# Patient Record
Sex: Male | Born: 1985 | Race: White | Hispanic: No | Marital: Married | Smoking: Current every day smoker
Health system: Southern US, Community
[De-identification: ages and names within clinical notes are randomized; demographics above are authoritative.]

## PROBLEM LIST (undated history)

## (undated) DIAGNOSIS — R51 Headache: Secondary | ICD-10-CM

## (undated) DIAGNOSIS — J45909 Unspecified asthma, uncomplicated: Secondary | ICD-10-CM

## (undated) DIAGNOSIS — E109 Type 1 diabetes mellitus without complications: Secondary | ICD-10-CM

## (undated) HISTORY — DX: Unspecified asthma, uncomplicated: J45.909

## (undated) HISTORY — PX: HERNIA REPAIR: SHX51

## (undated) HISTORY — DX: Headache: R51

## (undated) HISTORY — DX: Type 1 diabetes mellitus without complications: E10.9

---
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
## 2006-09-03 ENCOUNTER — Emergency Department (HOSPITAL_COMMUNITY): Admission: EM | Admit: 2006-09-03 | Discharge: 2006-09-04 | Payer: Self-pay | Admitting: Emergency Medicine

## 2006-09-04 ENCOUNTER — Inpatient Hospital Stay (HOSPITAL_COMMUNITY): Admission: EM | Admit: 2006-09-04 | Discharge: 2006-09-07 | Payer: Self-pay | Admitting: Emergency Medicine

## 2007-10-07 ENCOUNTER — Emergency Department (HOSPITAL_COMMUNITY): Admission: EM | Admit: 2007-10-07 | Discharge: 2007-10-07 | Payer: Self-pay | Admitting: Emergency Medicine

## 2008-02-26 ENCOUNTER — Ambulatory Visit: Payer: Self-pay | Admitting: Diagnostic Radiology

## 2008-02-26 ENCOUNTER — Emergency Department (HOSPITAL_BASED_OUTPATIENT_CLINIC_OR_DEPARTMENT_OTHER): Admission: EM | Admit: 2008-02-26 | Discharge: 2008-02-26 | Payer: Self-pay | Admitting: Emergency Medicine

## 2008-04-07 ENCOUNTER — Ambulatory Visit: Payer: Self-pay | Admitting: Diagnostic Radiology

## 2008-04-07 ENCOUNTER — Emergency Department (HOSPITAL_BASED_OUTPATIENT_CLINIC_OR_DEPARTMENT_OTHER): Admission: EM | Admit: 2008-04-07 | Discharge: 2008-04-07 | Payer: Self-pay | Admitting: Emergency Medicine

## 2008-05-30 ENCOUNTER — Emergency Department (HOSPITAL_BASED_OUTPATIENT_CLINIC_OR_DEPARTMENT_OTHER): Admission: EM | Admit: 2008-05-30 | Discharge: 2008-05-30 | Payer: Self-pay | Admitting: Emergency Medicine

## 2008-06-21 ENCOUNTER — Emergency Department (HOSPITAL_BASED_OUTPATIENT_CLINIC_OR_DEPARTMENT_OTHER): Admission: EM | Admit: 2008-06-21 | Discharge: 2008-06-21 | Payer: Self-pay | Admitting: Emergency Medicine

## 2008-07-18 ENCOUNTER — Emergency Department (HOSPITAL_BASED_OUTPATIENT_CLINIC_OR_DEPARTMENT_OTHER): Admission: EM | Admit: 2008-07-18 | Discharge: 2008-07-18 | Payer: Self-pay | Admitting: Emergency Medicine

## 2008-10-05 ENCOUNTER — Emergency Department (HOSPITAL_BASED_OUTPATIENT_CLINIC_OR_DEPARTMENT_OTHER): Admission: EM | Admit: 2008-10-05 | Discharge: 2008-10-05 | Payer: Self-pay | Admitting: Emergency Medicine

## 2008-10-05 ENCOUNTER — Ambulatory Visit: Payer: Self-pay | Admitting: Diagnostic Radiology

## 2008-11-02 ENCOUNTER — Emergency Department (HOSPITAL_BASED_OUTPATIENT_CLINIC_OR_DEPARTMENT_OTHER): Admission: EM | Admit: 2008-11-02 | Discharge: 2008-11-02 | Payer: Self-pay | Admitting: Emergency Medicine

## 2008-11-02 ENCOUNTER — Ambulatory Visit: Payer: Self-pay | Admitting: Diagnostic Radiology

## 2009-04-19 ENCOUNTER — Emergency Department (HOSPITAL_BASED_OUTPATIENT_CLINIC_OR_DEPARTMENT_OTHER): Admission: EM | Admit: 2009-04-19 | Discharge: 2009-04-20 | Payer: Self-pay | Admitting: Emergency Medicine

## 2009-04-20 ENCOUNTER — Ambulatory Visit: Payer: Self-pay | Admitting: Radiology

## 2009-07-05 ENCOUNTER — Ambulatory Visit: Payer: Self-pay | Admitting: Diagnostic Radiology

## 2009-07-05 ENCOUNTER — Emergency Department (HOSPITAL_BASED_OUTPATIENT_CLINIC_OR_DEPARTMENT_OTHER): Admission: EM | Admit: 2009-07-05 | Discharge: 2009-07-06 | Payer: Self-pay | Admitting: Emergency Medicine

## 2009-07-09 ENCOUNTER — Ambulatory Visit: Payer: Self-pay | Admitting: Internal Medicine

## 2009-07-09 DIAGNOSIS — R51 Headache: Secondary | ICD-10-CM | POA: Insufficient documentation

## 2009-07-09 DIAGNOSIS — R519 Headache, unspecified: Secondary | ICD-10-CM | POA: Insufficient documentation

## 2009-07-09 DIAGNOSIS — F17200 Nicotine dependence, unspecified, uncomplicated: Secondary | ICD-10-CM | POA: Insufficient documentation

## 2009-07-09 DIAGNOSIS — E109 Type 1 diabetes mellitus without complications: Secondary | ICD-10-CM | POA: Insufficient documentation

## 2009-07-09 DIAGNOSIS — J45909 Unspecified asthma, uncomplicated: Secondary | ICD-10-CM | POA: Insufficient documentation

## 2009-07-23 ENCOUNTER — Telehealth: Payer: Self-pay | Admitting: Internal Medicine

## 2009-07-25 ENCOUNTER — Ambulatory Visit: Payer: Self-pay | Admitting: Diagnostic Radiology

## 2009-07-25 ENCOUNTER — Emergency Department (HOSPITAL_BASED_OUTPATIENT_CLINIC_OR_DEPARTMENT_OTHER): Admission: EM | Admit: 2009-07-25 | Discharge: 2009-07-25 | Payer: Self-pay | Admitting: Emergency Medicine

## 2009-08-02 ENCOUNTER — Emergency Department (HOSPITAL_BASED_OUTPATIENT_CLINIC_OR_DEPARTMENT_OTHER): Admission: EM | Admit: 2009-08-02 | Discharge: 2009-08-03 | Payer: Self-pay | Admitting: Emergency Medicine

## 2009-08-10 ENCOUNTER — Ambulatory Visit: Payer: Self-pay | Admitting: Internal Medicine

## 2009-08-10 LAB — CONVERTED CEMR LAB
BUN: 9 mg/dL (ref 6–23)
Chloride: 100 meq/L (ref 96–112)
Creatinine, Ser: 0.73 mg/dL (ref 0.40–1.50)
Creatinine, Urine: 82.1 mg/dL
Glucose, Bld: 224 mg/dL — ABNORMAL HIGH (ref 70–99)
Microalb Creat Ratio: 9 mg/g (ref 0.0–30.0)
Potassium: 3.8 meq/L (ref 3.5–5.3)
Sodium: 138 meq/L (ref 135–145)

## 2009-08-11 ENCOUNTER — Encounter: Payer: Self-pay | Admitting: Internal Medicine

## 2009-11-02 ENCOUNTER — Encounter: Payer: Self-pay | Admitting: Internal Medicine

## 2009-11-02 LAB — CONVERTED CEMR LAB
CO2: 26 meq/L (ref 19–32)
Chloride: 102 meq/L (ref 96–112)
Creatinine, Ser: 0.72 mg/dL (ref 0.40–1.50)
Creatinine, Urine: 53.6 mg/dL
Indirect Bilirubin: 0.4 mg/dL (ref 0.0–0.9)
Microalb Creat Ratio: 9.3 mg/g (ref 0.0–30.0)
Potassium: 4.3 meq/L (ref 3.5–5.3)
Total Bilirubin: 0.5 mg/dL (ref 0.3–1.2)
Total CHOL/HDL Ratio: 3.8
VLDL: 35 mg/dL (ref 0–40)

## 2009-11-11 ENCOUNTER — Ambulatory Visit: Payer: Self-pay | Admitting: Internal Medicine

## 2009-11-16 ENCOUNTER — Ambulatory Visit: Payer: Self-pay | Admitting: Diagnostic Radiology

## 2009-11-16 ENCOUNTER — Telehealth: Payer: Self-pay | Admitting: Internal Medicine

## 2009-11-16 ENCOUNTER — Emergency Department (HOSPITAL_BASED_OUTPATIENT_CLINIC_OR_DEPARTMENT_OTHER): Admission: EM | Admit: 2009-11-16 | Discharge: 2009-11-16 | Payer: Self-pay | Admitting: Emergency Medicine

## 2010-01-14 ENCOUNTER — Ambulatory Visit: Payer: Self-pay | Admitting: Internal Medicine

## 2010-03-01 ENCOUNTER — Ambulatory Visit
Admission: RE | Admit: 2010-03-01 | Discharge: 2010-03-01 | Payer: Self-pay | Source: Home / Self Care | Attending: Internal Medicine | Admitting: Internal Medicine

## 2010-03-01 DIAGNOSIS — M76899 Other specified enthesopathies of unspecified lower limb, excluding foot: Secondary | ICD-10-CM | POA: Insufficient documentation

## 2010-03-01 DIAGNOSIS — M25569 Pain in unspecified knee: Secondary | ICD-10-CM | POA: Insufficient documentation

## 2010-03-31 LAB — CONVERTED CEMR LAB
BUN: 15 mg/dL (ref 6–23)
Calcium: 9.9 mg/dL (ref 8.4–10.5)
Creatinine, Ser: 0.8 mg/dL (ref 0.40–1.50)
Glucose, Bld: 126 mg/dL — ABNORMAL HIGH (ref 70–99)
Hgb A1c MFr Bld: 8.9 % — ABNORMAL HIGH (ref <5.7)

## 2010-03-31 NOTE — Assessment & Plan Note (Signed)
Summary: 3 month fu/dt--Rm 3   Vital Signs:  Patient profile:   25 year old male Height:      71 inches Weight:      183.50 pounds BMI:     25.69 O2 Sat:      98 % on Room air Temp:     98.0 degrees F oral Pulse rate:   98 / minute Pulse rhythm:   regular Resp:     16 per minute BP sitting:   120 / 70  (right arm) Cuff size:   regular  Vitals Entered By: Mervin Kung CMA Goedde Dull) (November 11, 2009 3:32 PM)  O2 Flow:  Room air CC: Rm 3    3 month f/u.  Is Patient Diabetic? Yes   Primary Care Provider:  DThomos Lemons DO  CC:  Rm 3    3 month f/u. Marland Kitchen  History of Present Illness:  Type 1 Diabetes Mellitus Follow-Up      This is a 25 year old man who presents for Type 1 diabetes mellitus follow-up.  The patient reports polyuria, but denies self managed hypoglycemia and weight gain.  The patient denies the following symptoms: chest pain.  Since the last visit the patient reports poor dietary compliance, not exercising regularly, and monitoring blood glucose.    Preventive Screening-Counseling & Management  Alcohol-Tobacco     Alcohol drinks/day: 1     Alcohol type: all     Smoking Status: current     Packs/Day: 1.5     Year Started: 2005  Allergies (verified): No Known Drug Allergies  Past History:  Past Medical History: Asthma  -  childhood  Diabetes mellitus, type I Headache     Past Surgical History: Hernia repair at birth     Family History: Family History of Arthritis Family History Breast cancer -PGM Family History Hypertension -mother , brother Family History of Stroke - MGM DM II - sister, mother borderline colon ca - no  prostate - no    Social History: 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     Impression & Recommendations:  Problem # 1:  DIABETES  MELLITUS, TYPE I (ICD-250.01) Assessment Unchanged poor control.  Pt counseled on diet and exercise.  His updated medication list for this problem includes:    Lantus Solostar 100 Unit/ml Soln (Insulin glargine) ..... Use 20-40 units at bedtime    Novolog Flexpen 100 Unit/ml Soln (Insulin aspart) ..... Use sliding 10-20 units three times a day before meals  Labs Reviewed: Creat: 0.72 (11/02/2009)    Reviewed HgBA1c results: 10.5 (11/02/2009)  10.2 (08/10/2009)  Problem # 2:  TOBACCO ABUSE (ICD-305.1)  Encouraged smoking cessation and discussed different methods for smoking cessation.  trial of wellbutrin.    Complete Medication List: 1)  Lantus Solostar 100 Unit/ml Soln (Insulin glargine) .... Use 20-40 units at bedtime 2)  Novolog Flexpen 100 Unit/ml Soln (Insulin aspart) .... Use sliding 10-20 units three times a day before meals 3)  Relion Pen Needles 31g X 8 Mm Misc (Insulin pen needle) .... Use qid as directed 4)  Onetouch Ultra Test Strp (Glucose blood) .... Use qid as directed 5)  Bupropion Hcl 150 Mg Xr24h-tab (Bupropion hcl) .... One by mouth once daily  Patient Instructions: 1)  Please schedule a follow-up appointment in 3 months.  Prescriptions: NOVOLOG FLEXPEN 100 UNIT/ML SOLN (INSULIN ASPART) use sliding 10-20 units three times a day before meals  #3 month x 1   Entered and Authorized by:   D. Thomos Lemons DO   Signed by:   D. Thomos Lemons DO on 11/11/2009   Method used:   Electronically to        [facility]* (retail)             ,          Ph: [phone]       Fax: [phone]   RxID:   [id number] LANTUS SOLOSTAR 100 UNIT/ML SOLN (INSULIN GLARGINE) use 20-40 units at bedtime  #3 month x 1   Entered and Authorized by:   D. Thomos Lemons DO   Signed by:   D. Thomos Lemons DO on 11/11/2009   Method used:   Electronically to        [facility]* (retail)             ,          Ph: [phone]       Fax: [phone]   RxID:   [id number] BUPROPION HCL  150 MG XR24H-TAB (BUPROPION HCL) one by mouth once daily  #30 x 2   Entered and Authorized by:   D. Thomos Lemons DO   Signed by:   D. Thomos Lemons DO on 11/11/2009   Method used:   Electronically to        [facility] # [id number]* (retail)       [address] [place], [place]  [address]       Ph: [phone]       Fax: [phone]   RxID:   [id number] NOVOLOG FLEXPEN 100 UNIT/ML SOLN (INSULIN ASPART) use sliding 10-20 units three times a day before meals  #1 month x 0   Entered and Authorized by:   D. Thomos Lemons DO   Signed by:   D. Thomos Lemons DO on 11/11/2009   Method used:   Electronically to        [facility] # [id number]* (retail)       [address] [place], [place]  [address]       Ph: [phone]       Fax: [phone]   RxID:   [id number] LANTUS SOLOSTAR 100 UNIT/ML SOLN (INSULIN GLARGINE) use 20-40 units at bedtime  #1 month x 0   Entered and Authorized by:   D. Thomos Lemons DO   Signed by:   D. Thomos Lemons DO on 11/11/2009   Method used:   Electronically to        [facility] # [phone]* (retail)       [address] [place], [place]  [address]       Ph: [phone]       Fax: [phone]   RxID:   [id number]    Current Allergies (reviewed today): No known allergies

## 2010-03-31 NOTE — Assessment & Plan Note (Signed)
Summary: NEW PT EST CARE IS DIABETIC/DT   Vital Signs:  Patient profile:   25 year old male Height:      71 inches Weight:      185.50 pounds BMI:     25.97 O2 Sat:      97 % on Room air Temp:     98.4 degrees F oral Pulse rate:   94 / minute Pulse rhythm:   regular Resp:     18 per minute BP sitting:   120 / 64  (right arm) Cuff size:   regular  Vitals Entered By: Glendell Docker CMA (Jul 09, 2009 2:25 PM)  O2 Flow:  Room air CC: Rm 2- New Patient  Is Patient Diabetic? Yes Comments out of diabetic medication low blood sugar 65 high 300, avg 180-200 elevation is in usually around 5pm   Primary Care Provider:  D. Thomos Lemons DO  CC:  Rm 2- New Patient .  History of Present Illness: 25 y/o white male with DM I to establish  Diabetes diagnosed at 62.  fairly well controlled Hx of DKA Type I diabetic recently got insurance Lantus 18 units at bedtime Novolog - meal time - dose based upon carb intake low to mid 200's couple of lows while taking novolog - self managed  up to date with eye exams - last exam Oct 2010 - at [place]  age 2 - used nebulizer,  no chronic asthma chronic tobacco user     Preventive Screening-Counseling & Management  Alcohol-Tobacco     Alcohol drinks/day: 1     Alcohol type: all     Smoking Status: current     Packs/Day: 1.5     Year Started: 2005  Caffeine-Diet-Exercise     Caffeine use/day: 4-5 beverages daily     Does Patient Exercise: yes     Times/week: <3  Past History:  Past Medical History: Asthma  -  childhood  Diabetes mellitus, type I Headache  Past Surgical History: Hernia repair at birth  Family History: Family History of Arthritis Family History Breast cancer -PGM Family History Hypertension -mother , brother Family History of Stroke - MGM DM II - sister, mother borderline colon ca - no prostate - no  Social History: 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   Head:  normocephalic and atraumatic.   Eyes:  pupils equal, pupils round, and pupils reactive to light.   Ears:  R ear normal and L ear normal.   Mouth:  Oral mucosa and oropharynx without lesions or exudates.  Teeth in good repair. Neck:  supple, no masses, and no carotid bruits.   Lungs:  normal respiratory effort, normal breath sounds, and no wheezes.   Heart:  normal rate, regular rhythm, and no gallop.   Abdomen:  soft, non-tender, normal bowel sounds, no hepatomegaly, and no splenomegaly.   Extremities:  No lower extremity edema  Neurologic:  cranial nerves II-XII intact and gait normal.    Diabetes Management Exam:    Foot Exam (with socks and/or shoes not present):  Inspection:          Left foot: normal          Right foot: normal   Impression & Recommendations:  Problem # 1:  DIABETES MELLITUS, TYPE I (ICD-250.01) Insulin refilled.  Pt understands how to titrate short acting insulin.  no hx of severe hypoglycemia  The following medications were removed from the medication list:    Novolin R 100 Unit/ml Soln (Insulin regular human) ..... Inject subcutaneously 6 units sliding scale as directed His updated medication list for this problem includes:    Lantus Solostar 100 Unit/ml Soln (Insulin glargine) .Marland KitchenMarland KitchenMarland KitchenMarland Kitchen 18 units once daily    Novolog Flexpen 100 Unit/ml Soln (Insulin aspart) ..... Use sliding 6-8 units three times a day before meals  Future Orders: T-Basic Metabolic Panel [id number]) ... 08/10/2009 T-Hepatic Function [id number]) ... 08/10/2009 T-Lipid Profile [id number]) ... 08/10/2009 T- Hemoglobin A1C  ([id number]) ... 08/10/2009 T-Urine Microalbumin w/creat. ratio [id number]) ... 08/10/2009  Problem # 2:  TOBACCO ABUSE (ICD-305.1) Strongly encouraged tob cessation.  start chantix.  we discussed common side effects  His updated medication list for this problem includes:    Chantix Starting Month Pak 0.5 Mg X 11 & 1 Mg X 42 Tabs (Varenicline tartrate) ..... Use as directed    Chantix Continuing Month Pak 1 Mg Tabs (Varenicline tartrate) ..... Use as directed  Complete Medication List: 1)  Lantus Solostar 100 Unit/ml Soln (Insulin glargine) .Marland Kitchen.. 18 units once daily 2)  Novolog Flexpen 100 Unit/ml Soln (Insulin aspart) .... Use sliding 6-8 units three times a day before meals 3)  Relion Pen Needles 31g X 8 Mm Misc (Insulin pen needle) .... Use qid as directed 4)  Chantix Starting Month Pak 0.5 Mg X 11 & 1 Mg X 42 Tabs (Varenicline tartrate) .... Use as directed 5)  Chantix Continuing Month Pak 1 Mg Tabs (Varenicline tartrate) .... Use as directed  Patient Instructions: 1)  Please schedule a follow-up appointment in 1 month. 2)  BMP prior to visit, ICD-9:  250.01 3)  Hepatic Panel prior to visit, ICD-9: 250.01 4)  Lipid Panel prior to visit, ICD-9: 250.01 5)  HbgA1C prior to visit, ICD-9: 250.01 6)  Urine Microalbumin prior to visit, ICD-9: 250.01 7)  Please return for lab work one (1) week before your next appointment.  Prescriptions: CHANTIX CONTINUING MONTH PAK 1 MG TABS (VARENICLINE TARTRATE) use as directed  #1 x 2   Entered and Authorized by:   D. Thomos Lemons DO   Signed by:   D. Thomos Lemons DO on 07/09/2009   Method used:   Print then Give to Patient   RxID:   [id number] CHANTIX STARTING MONTH PAK 0.5 MG X 11 & 1 MG X 42 TABS (VARENICLINE TARTRATE) use as directed  #1 x 0   Entered and Authorized by:   D. Thomos Lemons DO   Signed by:   D. Thomos Lemons DO on 07/09/2009   Method used:   Print then Give to Patient   RxID:   [id number] RELION PEN NEEDLES 31G X 8 MM  MISC (INSULIN PEN NEEDLE) use qid as directed  #120 x 2   Entered and Authorized by:   D. Thomos Lemons DO   Signed by:   D. Thomos Lemons DO on 07/09/2009   Method used:   Electronically to        [facility] # [id number]* (retail)       [address]       [place], [place]  [address]       Ph: [phone]       Fax: [phone]   RxID:   [id number] NOVOLOG FLEXPEN 100 UNIT/ML SOLN (INSULIN ASPART) use sliding 6-8 units three times a day before meals  #1 month x 2   Entered and Authorized by:   D. Thomos Lemons DO   Signed by:   D. Thomos Lemons DO on 07/09/2009   Method used:   Electronically to        [facility] # [id number]* (retail)       [address] [place], [place]  [address]       Ph: [phone]       Fax: [phone]   RxID:   [id number] LANTUS SOLOSTAR 100 UNIT/ML SOLN (INSULIN GLARGINE) 18 units once daily  #1 month x 2   Entered and Authorized by:   D. Thomos Lemons DO   Signed by:   D. Thomos Lemons DO on 07/09/2009   Method used:   Electronically to        [facility] # [id number]* (retail)       [address] [place], [place]  [address]       Ph: [phone]       Fax: [phone]   RxID:   [id number]    Immunization History:  Tetanus/Td Immunization History:    Tetanus/Td:  historical (07/12/2004)

## 2010-03-31 NOTE — Assessment & Plan Note (Signed)
Summary: 1 MONTH FU/DT--Rm 2   Vital Signs:  Patient profile:   25 year old male Height:      71 inches Weight:      180 pounds BMI:     25.20 Temp:     98.3 degrees F oral Pulse rate:   102 / minute Pulse rhythm:   regular Resp:     18 per minute BP sitting:   110 / 76  (right arm) Cuff size:   regular  Vitals Entered By: Mervin Kung CMA (August 10, 2009 3:44 PM) CC: Room 2  1 month follow up. Is Patient Diabetic? Yes   Primary Care Provider:  Dondra Spry DO  CC:  Room 2  1 month follow up.Marland Kitchen  History of Present Illness: 25 y/o white male with hx of DM I and tob use for f/u tried chantix.  it worked well but he felt agitated. he attributes part of agitation to home situation  older brother lives with pt - on disability due to back pain pt pays all the bills.  brother invites friends over  -  late 3 AM not getting enough sleep thinking about moving out  DM I - had one severe hyperglycemia since prev visit Blood sugar > 600.  he was tubing on [facility].   got hot and drank several gatorades went to [facility].  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   Lungs:  normal respiratory effort and normal breath sounds.   Heart:  regular rhythm, no gallop, and  tachycardia.   Extremities:  No lower extremity edema    Impression & Recommendations:  Problem # 1:  DIABETES MELLITUS, TYPE I (ICD-250.01) pt  had one severe hyperglycemia since prev visit Blood sugar > 600.  he was tubing on [facility].   got hot and drank several gatorades went to [facility].  got fluids and insulin not checking his blood sugars regularly  I urged compliance.  new rx for meter provided.   His updated medication list for this problem includes:    Lantus Solostar 100 Unit/ml Soln (Insulin glargine) ..... Use 18 units at bedtime    Novolog Flexpen 100 Unit/ml Soln (Insulin aspart) ..... Use sliding 6-8 units three times a day before meals  Orders: T-Basic Metabolic Panel [id number]) T- Hemoglobin A1C ([id number]) T-Urine Microalbumin w/creat. ratio [phone])  Problem # 2:  TOBACCO ABUSE (ICD-305.1) chantix stopped due to concerns of agitation.  I suspect agitation from stressful home life.  We he moves out of current apartment, we discussed trying chantix again  Complete Medication List: 1)  Lantus Solostar 100 Unit/ml Soln (Insulin glargine) .... Use 18 units at bedtime 2)  Novolog Flexpen 100 Unit/ml Soln (Insulin aspart) .... Use sliding 6-8 units three  times a day before meals 3)  Relion Pen Needles 31g X 8 Mm Misc (Insulin pen needle) .... Use qid as directed 4)  Onetouch Ultra Test Strp (Glucose blood) .... Use qid as directed  Patient Instructions: 1)  Please schedule a follow-up appointment in 3 months. 2)  BMP prior to visit, ICD-9:  401.9 3)  Hepatic Panel prior to visit, ICD-9:  272.4 4)  Lipid Panel prior to visit, ICD-9:  272.4  5)  HbgA1C prior to visit, ICD-9: 250.01 6)  Please return for lab work one (1) week before your next appointment.  Prescriptions: ONETOUCH ULTRA TEST  STRP (GLUCOSE BLOOD) use qid as directed  #120 x 5   Entered and Authorized by:   D. Thomos Lemons DO   Signed by:   D. Thomos Lemons DO on 08/10/2009   Method  used:   Electronically to        [facility] # [id number]* (retail)       [address] [place], [place]  [address]       Ph: [phone]       Fax: [phone]   RxID:   [id number] RELION PEN NEEDLES 31G X 8 MM MISC (INSULIN PEN NEEDLE) use qid as directed  #120 x 5   Entered and Authorized by:   D. Thomos Lemons DO   Signed by:   D. Thomos Lemons DO on 08/10/2009   Method used:   Electronically to        [facility] # [id number]* (retail)       [address] [place], [place]  [address]       Ph: [phone]       Fax: [phone]   RxID:   [id number] NOVOLOG FLEXPEN 100 UNIT/ML SOLN (INSULIN ASPART) use sliding 6-8 units three times a day before meals  #1 month x 5   Entered and Authorized by:   D. Thomos Lemons DO   Signed by:   D. Thomos Lemons DO on 08/10/2009   Method used:   Electronically to        [facility] # [phone] [id number]* (retail)       [address] [place], [place]  [address]       Ph: [phone]       Fax: [phone]   RxID:   [id number] LANTUS SOLOSTAR 100 UNIT/ML SOLN (INSULIN GLARGINE) use 18 units at bedtime  #1 month x 5   Entered and Authorized by:   D. Thomos Lemons DO   Signed by:   D. Thomos Lemons DO on 08/10/2009   Method used:   Electronically to        [facility] # [id number]* (retail)       [address] [place], [place]  [address]       Ph: [phone]       Fax: [phone]   RxID:   [id number]   Current Allergies (reviewed today): No known allergies

## 2010-03-31 NOTE — Assessment & Plan Note (Signed)
Summary: 3 month fu/dt   Vital Signs:  Patient profile:   25 year old male Height:      71 inches Weight:      190 pounds BMI:     26.60 O2 Sat:      99 % on Room air Pulse rate:   99 / minute BP sitting:   132 / 60  (right arm) Cuff size:   large  Vitals Entered By: Payton Spark CMA (January 14, 2010 10:57 AM)  O2 Flow:  Room air CC: 3 mo f/u   Primary Care Mariabella Nilsen:  Dondra Spry DO  CC:  3 mo f/u.  History of Present Illness: 25 y/o white male for follow up  tried wellbutrin still unable to quit smoking wellbutrin helping with modd  DM II - having occassional  low BS - 54  insulins can be cost prohibitive he previous was not using short acting insulin on a regular basis    Current Medications (verified): 1)  Lantus Solostar 100 Unit/ml Soln (Insulin Glargine) .... Use 20-40 Units At Bedtime 2)  Novolog Flexpen 100 Unit/ml Soln (Insulin Aspart) .... Use Sliding 10-20 Units Three Times A Day Before Meals 3)  Relion Pen Needles 31g X 8 Mm Misc (Insulin Pen Needle) .... Use Qid As Directed 4)  Onetouch Ultra Test  Strp (Glucose Blood) .... Use Qid As Directed 5)  Bupropion Hcl 150 Mg Xr24h-Tab (Bupropion Hcl) .... One By Mouth Once Daily  Allergies (verified): No Known Drug Allergies  Past History:  Past Medical History: Asthma  -  childhood  Diabetes mellitus, type I Headache      Past Surgical History: Hernia repair at birth      Physical Exam  General:  alert, well-developed, and well-nourished.   Lungs:  normal respiratory effort and normal breath sounds.   Heart:  regular rhythm, no gallop, and tachycardia.   Neurologic:  cranial nerves II-XII intact and gait normal.   Psych:  normally interactive, good eye contact, not anxious appearing, and not depressed appearing.     Impression & Recommendations:  Problem # 1:  DIABETES MELLITUS, TYPE I (ICD-250.01) Assessment Unchanged pt having intermittent low blood sugars.  he was previously not  using short activing insulin due to cost reasons. pt to decrease lantus dose and titrte apidra based upon carb intake  The following medications were removed from the medication list:    Novolog Flexpen 100 Unit/ml Soln (Insulin aspart) ..... Use sliding 10-20 units three times a day before meals His updated medication list for this problem includes:    Lantus Solostar 100 Unit/ml Soln (Insulin glargine) ..... Use 20-40 units at bedtime    Apidra 100 Unit/ml Soln (Insulin glulisine) .Marland KitchenMarland KitchenMarland KitchenMarland Kitchen 10-20 units three times a day as directed  Future Orders: T-Basic Metabolic Panel [id number]) ... 04/04/2010 T- Hemoglobin A1C ([id number]) ... 04/04/2010  Problem # 2:  TOBACCO ABUSE (ICD-305.1) Assessment: Improved smoking less with wellbutrin mood better continue wellbutrin  Complete Medication List: 1)  Lantus Solostar 100 Unit/ml Soln (Insulin glargine) .... Use 20-40 units at bedtime 2)  Relion Pen Needles 31g X 8 Mm Misc (Insulin pen needle) .... Use qid as directed 3)  Onetouch Ultra Test Strp (Glucose blood) .... Use qid as directed 4)  Bupropion Hcl 150 Mg Xr24h-tab (Bupropion hcl) .... One by mouth once daily 5)  Apidra 100 Unit/ml Soln (Insulin glulisine) .Marland Kitchen.. 10-20 units three times a day as directed  Patient Instructions: 1)  Please schedule a follow-up appointment  in 2 months. 2)  BMP prior to visit, ICD-9:  250.02 3)  HbgA1C prior to visit, ICD-9: 250.02 4)  Please return for lab work one (1) week before your next appointment.  Prescriptions: LANTUS SOLOSTAR 100 UNIT/ML SOLN (INSULIN GLARGINE) use 20-40 units at bedtime  #1 month x 3   Entered and Authorized by:   D. Thomos Lemons DO   Signed by:   D. Thomos Lemons DO on 01/14/2010   Method used:   Print then Give to Patient   RxID:   [id number] APIDRA 100 UNIT/ML SOLN (INSULIN GLULISINE) 10-20 units three times a day as directed  #2 vials x 3   Entered and Authorized by:   D. Thomos Lemons DO   Signed by:   D. Thomos Lemons DO on  01/14/2010   Method used:   Print then Give to Patient   RxID:   [id number] BUPROPION HCL 150 MG XR24H-TAB (BUPROPION HCL) one by mouth once daily  #30 x 5   Entered and Authorized by:   D. Thomos Lemons DO   Signed by:   D. Thomos Lemons DO on 01/14/2010   Method used:   Electronically to        [facility] # [id number]* (retail)       [address] [place], [place]  [address]       Ph: [phone]       Fax: [phone]   RxID:   [id number]    Orders Added: 1)  T-Basic Metabolic Panel [id number] 2)  T- Hemoglobin A1C [[id number]] 3)  Est. Patient Level III [[address]]

## 2010-03-31 NOTE — Letter (Signed)
   [facility] [address] [place], [place]  [address]  [place] Phone: [phone]      August 11, 2009   Sean Richards [address] [place], [place] [address]  RE:  LAB RESULTS  Dear  [name],  The following is an interpretation of your most recent lab tests.  Please take note of any instructions provided or changes to medications that have resulted from your lab work.  ELECTROLYTES:  Good - no changes needed  KIDNEY FUNCTION TESTS:  Good - no changes needed  DIABETIC STUDIES:  Poor - schedule a follow-up appointment soon Blood Glucose: 224   HgbA1C: 10.2   Microalbumin/Creatinine Ratio: 9.0     Please bring your glucometer to your next follow up appointment.   I suggest earlier appointment within one month.       Sincerely Yours,    Dr. Thomos Lemons

## 2010-03-31 NOTE — Progress Notes (Signed)
Summary: Side Effect to Chantix  Phone Note Call from Patient Call back at [phone]  Can leave message on machine   Caller: Patient Call For: D. Thomos Lemons DO Summary of Call: Pt states he is having episodes of increased anger.  Feels agitated all the time.  Gets irritated over small things. Notes that this started after beginning Chantix.  Please advise?  Mervin Kung CMA  Jul 23, 2009 11:48 AM   Follow-up for Phone Call        stop chantix.  we will discuss other treatment options for smoking cessation at next OV Follow-up by: D. Thomos Lemons DO,  Jul 23, 2009 12:15 PM  Additional Follow-up for Phone Call Additional follow up Details #1::        Left message on pt's cell # advising him of Dr. Olegario Messier instructions and to call if has any questions.  Mervin Kung CMA  Jul 23, 2009 1:53 PM

## 2010-03-31 NOTE — Progress Notes (Signed)
Summary: Refills to [facility]  Phone Note Other Incoming   Caller: Fax from [employer]: Received fax from [facility] that they are unable to find pt in their system and cannot process rxs from 11/11/09 . They requested a member ID that we do not have on file. They have a Durenda Hurt on file but not for the state of [place]. Left message for pt to return my call with member ID. Nicki Guadalajara Fergerson CMA Daubert Dull)  November 16, 2009 11:54 AM   Follow-up for Phone Call        left message on machine to return my call with [facility] member ID or correct mail order service. Nicki Guadalajara Fergerson CMA Couts Dull)  November 18, 2009 2:21 PM   Additional Follow-up for Phone Call Additional follow up Details #1::        he is not with [facility] he is with [facility]  Roselle Locus  November 23, 2009 9:00 AM    Additional Follow-up for Phone Call Additional follow up Details #2::    plz [facility]  Follow-up by: D. Thomos Lemons DO,  November 23, 2009 1:39 PM  Additional Follow-up for Phone Call Additional follow up Details #3:: Details for Additional Follow-up Action Taken: Rxs re sent and pt notified. Nicki Guadalajara Fergerson CMA Granato Dull)  November 23, 2009 2:16 PM   Prescriptions: NOVOLOG FLEXPEN 100 UNIT/ML SOLN (INSULIN ASPART) use sliding 10-20 units three times a day before meals  #3 month x 1   Entered by:   Mervin Kung CMA (AAMA)   Authorized by:   D. Thomos Lemons DO   Signed by:   Mervin Kung CMA (AAMA) on 11/23/2009   Method used:   Faxed to ...       [facility] [phone] (retail)       [address] [address]       [place], [place]  [id number]       Ph: [phone]       Fax: [phone]   RxID:   [id number] LANTUS SOLOSTAR 100 UNIT/ML SOLN (INSULIN GLARGINE) use 20-40 units at bedtime  #3 month x 1   Entered by:   Mervin Kung CMA (AAMA)   Authorized by:   D. Thomos Lemons DO   Signed by:   Mervin Kung CMA (AAMA) on 11/23/2009   Method used:   Faxed to  ...       [facility] [phone] (retail)       [address] [address]       [place], [place]  [id number]       Ph: [phone]       Fax: [phone]   RxID:   [id number]

## 2010-03-31 NOTE — Assessment & Plan Note (Signed)
Summary: knot on knee painful/mhf   Vital Signs:  Patient profile:   25 year old male Height:      71 inches Weight:      199 pounds BMI:     27.86 O2 Sat:      99 % on Room air Temp:     98.2 degrees F oral Pulse rate:   101 / minute Resp:     18 per minute BP sitting:   122 / 60  (left arm) Cuff size:   large  Vitals Entered By: Glendell Docker CMA (March 01, 2010 1:58 PM)  O2 Flow:  Room air CC: Bilateral Knee discomfort Is Patient Diabetic? Yes Did you bring your meter with you today? No Pain Assessment Patient in pain? no        Primary Care Provider:  Dondra Spry DO  CC:  Bilateral Knee discomfort.  History of Present Illness:  25 y/o c/o left knee pain he has intermittent swelling of left knee cap job function sometimes requires kneeling on left knee  remote right knee injury right   Preventive Screening-Counseling & Management  Alcohol-Tobacco     Smoking Status: current  Allergies (verified): No Known Drug Allergies  Past History:  Past Medical History: Asthma  -  childhood  Diabetes mellitus, type I  Headache      Past Surgical History: Hernia repair at birth       Family History: Family History of Arthritis Family History Breast cancer -PGM Family History Hypertension -mother , brother Family History of Stroke - MGM DM II - sister, mother borderline colon ca - no  prostate - no     Social History: 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   Lungs:  normal respiratory effort and normal breath sounds.   Heart:  normal rate, regular rhythm, and no gallop.   Msk:  mild swelling of left patella bursa  slight atrophy of right quadricep muscles   Impression & Recommendations:  Problem # 1:  BURSITIS, LEFT KNEE (ICD-726.60) mild left patellar  bursitis avoid kneeling on left knee use cold compress as directed if bursa enlarges, we discussed draining fluid then injecting cortisone  Problem # 2:  PATELLO-FEMORAL SYNDROME (ICD-719.46) remote right knee injury.  knee got hyperextended.  when he kneels - he can exp popping sensation trial of leg extension exercises if no improvement, we discussed ortho referral  Complete Medication List: 1)  Lantus Solostar 100 Unit/ml Soln (Insulin glargine) .... Use 20-40 units at bedtime 2)  Relion Pen Needles 31g X 8 Mm Misc (Insulin pen needle) .... Use qid as directed 3)  Onetouch Ultra Test Strp (Glucose blood) .... Use qid as directed 4)  Bupropion Hcl 150 Mg Xr24h-tab (Bupropion hcl) .... One by mouth once daily 5)  Apidra 100 Unit/ml Soln (Insulin glulisine) .Marland Kitchen.. 10-20 units three times a day as directed   Orders Added: 1)  Est. Patient Level III [04540]   Immunization History:  Influenza Immunization History:    Influenza:  historical (12/07/2009)   Immunization History:  Influenza Immunization History:    Influenza:  Historical (12/07/2009)  Current Allergies (reviewed today): No known allergies

## 2010-04-07 ENCOUNTER — Encounter: Payer: Self-pay | Admitting: Internal Medicine

## 2010-04-07 ENCOUNTER — Ambulatory Visit (INDEPENDENT_AMBULATORY_CARE_PROVIDER_SITE_OTHER): Payer: Commercial Indemnity | Admitting: Internal Medicine

## 2010-04-07 DIAGNOSIS — E109 Type 1 diabetes mellitus without complications: Secondary | ICD-10-CM

## 2010-04-07 DIAGNOSIS — F17200 Nicotine dependence, unspecified, uncomplicated: Secondary | ICD-10-CM

## 2010-04-08 ENCOUNTER — Encounter (INDEPENDENT_AMBULATORY_CARE_PROVIDER_SITE_OTHER): Payer: Self-pay | Admitting: *Deleted

## 2010-04-12 ENCOUNTER — Ambulatory Visit: Payer: Self-pay | Admitting: Internal Medicine

## 2010-04-14 NOTE — Letter (Signed)
Summary: Primary Care Consult Scheduled Letter  [place] [facility]  [address]   [place], [place] [address]   Phone: [phone]  Fax: [phone]      04/08/2010 MRN: [id number]  Sean Richards [address] [place] [place], [place]  [address]  [place]    Dear [name],      We have scheduled an appointment for you.  At the recommendation of Dr.YOO, we have scheduled you a consult with [facility]  ,DR [facility]  on MARCH 7.2012 at 3:15PM .  Their address is_[address]  [place]  [place]  [address]. The office phone number is [phone].  If this appointment day and time is not convenient for you, please feel free to call the office of the doctor you are being referred to at the number listed above and reschedule the appointment.     It is important for you to keep your scheduled appointments. We are here to make sure you are given good patient care.     Thank you,  Darral Dash Patient Care Coordinator [place] at [facility]

## 2010-04-26 NOTE — Assessment & Plan Note (Signed)
Summary: 3 month follow up/ss   Vital Signs:  Patient profile:   25 year old male Height:      71 inches Weight:      187.56 pounds BMI:     26.25 O2 Sat:      98 % on Room air Temp:     98.1 degrees F oral Pulse rate:   93 / minute Resp:     20 per minute BP sitting:   120 / 60  (right arm) Cuff size:   large  Vitals Entered By: Glendell Docker CMA (April 07, 2010 3:23 PM)  O2 Flow:  Room air CC: 3 month follow up  Is Patient Diabetic? Yes Did you bring your meter with you today? No Pain Assessment Patient in pain? no      Comments low blood sugar 80 high 210 avg hig 180's, not checking on a regular basis, no concerns, last eye exam was 2-3 years ago.   Primary Care Provider:  Dondra Spry DO  CC:  3 month follow up .  History of Present Illness: 25 yo white male for DM I follow up  2 episodes of hypoglycemia  - in 89 's doesn't take blood sugar regularly.  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   Lungs:  normal respiratory effort and normal breath sounds.   Heart:  normal rate, regular rhythm, and no gallop.   Extremities:  No lower extremity edema    Impression &  Recommendations:  Problem # 1:  DIABETES MELLITUS, TYPE I (ICD-250.01) Assessment Unchanged encourage dietary change reviewed insulin matching  His updated medication list for this problem includes:    Lantus Solostar 100 Unit/ml Soln (Insulin glargine) ..... Use 20-40 units at bedtime    Apidra 100 Unit/ml Soln (Insulin glulisine) .Marland KitchenMarland KitchenMarland KitchenMarland Kitchen 10-30 units three times a day as directed  Orders: Ophthalmology Referral (Ophthalmology)  Problem # 2:  TOBACCO ABUSE (ICD-305.1) Assessment: Improved improved but unable to quit wellbutrin helping with mood  Complete Medication List: 1)  Lantus Solostar 100 Unit/ml Soln (Insulin glargine) .... Use 20-40 units at bedtime 2)  Relion Pen Needles 31g X 8 Mm Misc (Insulin pen needle) .... Use qid as directed 3)  Onetouch Ultra Test Strp (Glucose blood) .... Use qid as directed 4)  Bupropion Hcl 150 Mg Xr24h-tab (Bupropion hcl) .... One by mouth once daily 5)  Apidra 100 Unit/ml Soln (Insulin glulisine) .Marland Kitchen.. 10-30 units three times a day as directed  Patient Instructions: 1)  Please schedule a follow-up appointment in 3 months. 2)  BMP prior to visit, ICD-9: 250.01 3)  HbgA1C prior to visit, ICD-9: 250.01  4)  Urine Microalbumin prior to visit, ICD-9: 250.01 5)  Please return for lab work one (1) week before your next appointment.  Prescriptions: BUPROPION HCL 150 MG XR24H-TAB (BUPROPION HCL) one by mouth once daily  #30 x 5   Entered and Authorized by:   D. Thomos Lemons DO   Signed by:   D. Thomos Lemons DO on 04/07/2010   Method used:   Print then Give to Patient   RxID:   [id number] LANTUS SOLOSTAR 100 UNIT/ML SOLN (INSULIN GLARGINE) use 20-40 units at bedtime  #1 month x 5   Entered and Authorized by:   D. Thomos Lemons DO   Signed by:   D. Thomos Lemons DO on 04/07/2010   Method used:   Print then Give to Patient   RxID:   [id number] APIDRA 100 UNIT/ML SOLN (INSULIN GLULISINE) 10-30 units three times a day as directed  #2 vials x 5    Entered and Authorized by:   D. Thomos Lemons DO   Signed by:   D. Thomos Lemons DO on 04/07/2010   Method used:   Print then Give to Patient   RxID:   [id number]    Orders Added: 1)  Ophthalmology Referral [Ophthalmology] 2)  Est. Patient Level III [01027]     Current Allergies (reviewed today): No known allergies

## 2010-05-12 LAB — COMPREHENSIVE METABOLIC PANEL
ALT: 14 U/L (ref 0–53)
Albumin: 4 g/dL (ref 3.5–5.2)
Alkaline Phosphatase: 81 U/L (ref 39–117)
CO2: 23 mEq/L (ref 19–32)
Creatinine, Ser: 0.6 mg/dL (ref 0.4–1.5)
GFR calc Af Amer: >60 mL/min (ref >60)
GFR calc non Af Amer: >60 mL/min (ref >60)
Sodium: 140 mEq/L (ref 135–145)

## 2010-05-12 LAB — DIFFERENTIAL
Basophils Absolute: 0.1 10*3/uL (ref 0.0–0.1)
Eosinophils Relative: 6 % — ABNORMAL HIGH (ref 0–5)
Monocytes Relative: 7 % (ref 3–12)
Neutrophils Relative %: 61 % (ref 43–77)

## 2010-05-12 LAB — CBC

## 2010-05-12 LAB — LIPASE, BLOOD: Lipase: 36 U/L (ref 23–300)

## 2010-05-12 LAB — POCT CARDIAC MARKERS
CKMB, poc: <1 ng/mL — ABNORMAL LOW (ref 1.0–8.0)
Troponin i, poc: <0.05 ng/mL (ref 0.00–0.09)

## 2010-05-16 LAB — GLUCOSE, CAPILLARY
Glucose-Capillary: 250 mg/dL — ABNORMAL HIGH (ref 70–99)
Glucose-Capillary: 327 mg/dL — ABNORMAL HIGH (ref 70–99)
Glucose-Capillary: 349 mg/dL — ABNORMAL HIGH (ref 70–99)
Glucose-Capillary: 404 mg/dL — ABNORMAL HIGH (ref 70–99)
Glucose-Capillary: >600 mg/dL (ref 70–99)

## 2010-05-16 LAB — CBC
HCT: 47.5 % (ref 39.0–52.0)
Hemoglobin: 16.4 g/dL (ref 13.0–17.0)
MCHC: 34.5 g/dL (ref 30.0–36.0)
Platelets: 233 10*3/uL (ref 150–400)
RBC: 5.26 MIL/uL (ref 4.22–5.81)
WBC: 7.9 10*3/uL (ref 4.0–10.5)

## 2010-05-16 LAB — BASIC METABOLIC PANEL
Chloride: 97 mEq/L (ref 96–112)
GFR calc Af Amer: >60 mL/min (ref >60)
GFR calc non Af Amer: >60 mL/min (ref >60)
Glucose, Bld: 688 mg/dL (ref 70–99)

## 2010-05-16 LAB — DIFFERENTIAL
Basophils Absolute: 0.1 10*3/uL (ref 0.0–0.1)
Basophils Relative: 1 % (ref 0–1)
Eosinophils Absolute: 0.3 10*3/uL (ref 0.0–0.7)
Monocytes Absolute: 0.5 10*3/uL (ref 0.1–1.0)
Neutro Abs: 5.4 10*3/uL (ref 1.7–7.7)

## 2010-05-16 LAB — URINALYSIS, ROUTINE W REFLEX MICROSCOPIC
Leukocytes, UA: NEGATIVE
Protein, ur: NEGATIVE mg/dL
pH: 7 (ref 5.0–8.0)

## 2010-05-16 LAB — URINE MICROSCOPIC-ADD ON
RBC / HPF: NONE SEEN RBC/hpf (ref <3)
WBC, UA: NONE SEEN WBC/hpf (ref <3)

## 2010-05-16 LAB — POCT I-STAT 3, VENOUS BLOOD GAS (G3P V)
TCO2: 23 mmol/L (ref 0–100)
pCO2, Ven: 37.2 mmHg — ABNORMAL LOW (ref 45.0–50.0)
pH, Ven: 7.377 — ABNORMAL HIGH (ref 7.250–7.300)

## 2010-05-17 LAB — GLUCOSE, CAPILLARY: Glucose-Capillary: 337 mg/dL — ABNORMAL HIGH (ref 70–99)

## 2010-05-18 LAB — BASIC METABOLIC PANEL
BUN: 13 mg/dL (ref 6–23)
CO2: 29 mEq/L (ref 19–32)
Chloride: 103 mEq/L (ref 96–112)
Creatinine, Ser: 0.6 mg/dL (ref 0.4–1.5)
Glucose, Bld: 42 mg/dL — CL (ref 70–99)
Potassium: 3.7 mEq/L (ref 3.5–5.1)

## 2010-05-18 LAB — CBC
HCT: 47.4 % (ref 39.0–52.0)
MCHC: 35.4 g/dL (ref 30.0–36.0)
MCV: 92 fL (ref 78.0–100.0)
Platelets: 297 10*3/uL (ref 150–400)
WBC: 10.3 10*3/uL (ref 4.0–10.5)

## 2010-05-18 LAB — DIFFERENTIAL
Basophils Relative: 1 % (ref 0–1)
Eosinophils Absolute: 0.4 10*3/uL (ref 0.0–0.7)
Eosinophils Relative: 4 % (ref 0–5)
Lymphs Abs: 2.9 10*3/uL (ref 0.7–4.0)
Neutrophils Relative %: 52 % (ref 43–77)

## 2010-05-18 LAB — GLUCOSE, CAPILLARY: Glucose-Capillary: 110 mg/dL — ABNORMAL HIGH (ref 70–99)

## 2010-06-04 LAB — DIFFERENTIAL
Basophils Absolute: 0.2 10*3/uL — ABNORMAL HIGH (ref 0.0–0.1)
Eosinophils Relative: 4 % (ref 0–5)
Lymphocytes Relative: 21 % (ref 12–46)
Monocytes Relative: 8 % (ref 3–12)
Neutrophils Relative %: 65 % (ref 43–77)

## 2010-06-04 LAB — POCT TOXICOLOGY PANEL

## 2010-06-04 LAB — CBC
HCT: 49.5 % (ref 39.0–52.0)
MCHC: 34.9 g/dL (ref 30.0–36.0)
MCV: 91.2 fL (ref 78.0–100.0)
Platelets: 252 10*3/uL (ref 150–400)
RBC: 5.42 MIL/uL (ref 4.22–5.81)
WBC: 8.6 10*3/uL (ref 4.0–10.5)

## 2010-06-04 LAB — URINALYSIS, ROUTINE W REFLEX MICROSCOPIC
Bilirubin Urine: NEGATIVE
Leukocytes, UA: NEGATIVE
Nitrite: NEGATIVE
Specific Gravity, Urine: 1.029 (ref 1.005–1.030)
Urobilinogen, UA: 0.2 mg/dL (ref 0.0–1.0)

## 2010-06-04 LAB — COMPREHENSIVE METABOLIC PANEL
BUN: 14 mg/dL (ref 6–23)
CO2: 25 mEq/L (ref 19–32)
Calcium: 9.4 mg/dL (ref 8.4–10.5)
Chloride: 93 mEq/L — ABNORMAL LOW (ref 96–112)
Creatinine, Ser: 0.7 mg/dL (ref 0.4–1.5)
GFR calc non Af Amer: >60 mL/min (ref >60)
Total Bilirubin: 0.5 mg/dL (ref 0.3–1.2)

## 2010-06-04 LAB — GLUCOSE, CAPILLARY
Glucose-Capillary: 298 mg/dL — ABNORMAL HIGH (ref 70–99)
Glucose-Capillary: >600 mg/dL (ref 70–99)

## 2010-06-04 LAB — URINE MICROSCOPIC-ADD ON

## 2010-06-04 LAB — POCT CARDIAC MARKERS: Myoglobin, poc: 32.7 ng/mL (ref 12–200)

## 2010-06-07 LAB — GLUCOSE, CAPILLARY

## 2010-06-08 LAB — GLUCOSE, CAPILLARY
Glucose-Capillary: 295 mg/dL — ABNORMAL HIGH (ref 70–99)
Glucose-Capillary: 468 mg/dL — ABNORMAL HIGH (ref 70–99)

## 2010-06-08 LAB — POCT I-STAT 3, ART BLOOD GAS (G3+)
Bicarbonate: 21.6 mEq/L (ref 20.0–24.0)
O2 Saturation: 96 %
TCO2: 23 mmol/L (ref 0–100)
pCO2 arterial: 39.2 mmHg (ref 35.0–45.0)
pO2, Arterial: 83 mmHg (ref 80.0–100.0)

## 2010-06-08 LAB — CBC
HCT: 47.5 % (ref 39.0–52.0)
Hemoglobin: 16.3 g/dL (ref 13.0–17.0)
MCHC: 34.4 g/dL (ref 30.0–36.0)
Platelets: 253 10*3/uL (ref 150–400)
RDW: 12.1 % (ref 11.5–15.5)

## 2010-06-08 LAB — URINALYSIS, ROUTINE W REFLEX MICROSCOPIC
Bilirubin Urine: NEGATIVE
Ketones, ur: NEGATIVE mg/dL
Nitrite: NEGATIVE
Protein, ur: NEGATIVE mg/dL
Urobilinogen, UA: 0.2 mg/dL (ref 0.0–1.0)

## 2010-06-08 LAB — BASIC METABOLIC PANEL
BUN: 13 mg/dL (ref 6–23)
CO2: 24 mEq/L (ref 19–32)
Chloride: 98 mEq/L (ref 96–112)
Creatinine, Ser: 0.7 mg/dL (ref 0.4–1.5)

## 2010-06-08 LAB — DIFFERENTIAL
Basophils Absolute: 0.2 10*3/uL — ABNORMAL HIGH (ref 0.0–0.1)
Basophils Relative: 3 % — ABNORMAL HIGH (ref 0–1)
Eosinophils Absolute: 0.4 10*3/uL (ref 0.0–0.7)
Eosinophils Relative: 7 % — ABNORMAL HIGH (ref 0–5)
Monocytes Absolute: 0.5 10*3/uL (ref 0.1–1.0)

## 2010-06-08 LAB — URINE MICROSCOPIC-ADD ON

## 2010-06-14 LAB — POCT CARDIAC MARKERS
Myoglobin, poc: 35.4 ng/mL (ref 12–200)
Myoglobin, poc: 36.8 ng/mL (ref 12–200)
Troponin i, poc: <0.05 ng/mL (ref 0.00–0.09)

## 2010-06-14 LAB — COMPREHENSIVE METABOLIC PANEL
ALT: 15 U/L (ref 0–53)
AST: 28 U/L (ref 0–37)
CO2: 18 mEq/L — ABNORMAL LOW (ref 19–32)
Chloride: 92 mEq/L — ABNORMAL LOW (ref 96–112)
GFR calc Af Amer: >60 mL/min (ref >60)
GFR calc non Af Amer: >60 mL/min (ref >60)
Sodium: 130 mEq/L — ABNORMAL LOW (ref 135–145)
Total Bilirubin: 1.7 mg/dL — ABNORMAL HIGH (ref 0.3–1.2)

## 2010-06-14 LAB — URINALYSIS, ROUTINE W REFLEX MICROSCOPIC
Ketones, ur: >80 mg/dL — AB
Leukocytes, UA: NEGATIVE
Nitrite: NEGATIVE
Protein, ur: NEGATIVE mg/dL

## 2010-06-14 LAB — CBC
MCV: 91.6 fL (ref 78.0–100.0)
RBC: 5.36 MIL/uL (ref 4.22–5.81)
WBC: 7.3 10*3/uL (ref 4.0–10.5)

## 2010-06-14 LAB — GLUCOSE, CAPILLARY: Glucose-Capillary: 319 mg/dL — ABNORMAL HIGH (ref 70–99)

## 2010-06-14 LAB — DIFFERENTIAL
Basophils Absolute: 0.2 10*3/uL — ABNORMAL HIGH (ref 0.0–0.1)
Eosinophils Absolute: 0.2 10*3/uL (ref 0.0–0.7)
Eosinophils Relative: 3 % (ref 0–5)

## 2010-06-14 LAB — POCT I-STAT 3, VENOUS BLOOD GAS (G3P V)
Acid-base deficit: 3 mmol/L — ABNORMAL HIGH (ref 0.0–2.0)
O2 Saturation: 66 %
pO2, Ven: 35 mmHg (ref 30.0–45.0)

## 2010-07-01 ENCOUNTER — Encounter: Payer: Self-pay | Admitting: Internal Medicine

## 2010-07-04 ENCOUNTER — Ambulatory Visit: Payer: Commercial Indemnity | Admitting: Internal Medicine

## 2010-07-04 DIAGNOSIS — Z0289 Encounter for other administrative examinations: Secondary | ICD-10-CM

## 2010-07-12 NOTE — Discharge Summary (Signed)
[place], Richards          ACCOUNT NO.:  [id number]   MEDICAL RECORD NO.:  [id number]          PATIENT TYPE:  INP   LOCATION:  1534                         FACILITY:  [facility]   PHYSICIAN:  Madaline Savage, MD        DATE OF BIRTH:  June 22, 1985   DATE OF ADMISSION:  09/05/2006  DATE OF DISCHARGE:  09/07/2006                               DISCHARGE SUMMARY   PRIMARY CARE PHYSICIAN:  This is a patient who is unassigned to [place].   PRIMARY ENDOCRINOLOGIST:  His primary endocrinologist is Dr. Roanna Raider in  [facility].   DISCHARGE DIAGNOSES:  1. Diabetic ketoacidosis.  2. Diabetes mellitus type 1.  3. Acute gastroenteritis.   DISCHARGE MEDICATIONS:  He is advised to take Lantus 15 units subcu  tonight and then take 30 units of Lantus at bedtime from tomorrow.  He  will continue his sliding scale as before.   HISTORY OF PRESENT ILLNESS:  For full History and Physical see the  History and Physical dictated by Dr. Lovell Sheehan on September 05, 2006.  In short  Sean Richards is a 25 year old gentleman with a history of type 1 diabetes  mellitus who comes in with abdominal pain for the last couple of days.  In the emergency room he was found to be in diabetic ketoacidosis and  also was found to have an elevated white count of 33,000.  He was  admitted for further evaluation and management.   PROBLEM LIST:  1. Diabetic ketoacidosis.  He was profoundly acidotic with a bicarb of      less than 10 on admission.  Once his blood sugars started coming      down, his acidosis corrected and this time his bicarb is 25.  He      was restarted on his home dose of Lantus, but he was not given any      Lantus last night, but his sugars have been well-controlled and it      is up to 250.  We will give him 15 units of Lantus now before      discharge, and I advised him to take 15 units of Lantus at bedtime      tonight.  He will restart on his home dose of 30 units of Lantus      from tomorrow.  He will also continue his  sliding scale as before,      and he will follow up with his endocrinologist as an outpatient in      one week.  We can also consider adding meal coverage as an      outpatient.  2. Diarrhea.  He had one episode of diarrhea before he came.  He most      likely had a gastroenteritis.  He did have an elevated white count      of 33,000, but the next day his white count came down to normal at      6000.  Again at the time of discharge his white count is again in      the normal range.  He has not had any fever  or any chills.  He most      likely had a self-limiting gastroenteritis which was resolved.   DISPOSITION:  He is now being discharged home in stable condition.   FOLLOW UP:  He is asked to follow up with his endocrinologist, Dr.  Roanna Raider, at [facility] in one week.      Madaline Savage, MD  Electronically Signed     PKN/MEDQ  D:  09/07/2006  T:  09/08/2006  Job:  [id number]

## 2010-07-12 NOTE — H&P (Signed)
Sean Richards          ACCOUNT NO.:  [id number]   MEDICAL RECORD NO.:  [id number]          PATIENT TYPE:  INP   LOCATION:  1234                         FACILITY:  [facility]   PHYSICIAN:  Della Goo, M.D. DATE OF BIRTH:  1985-07-07   DATE OF ADMISSION:  09/04/2006  DATE OF DISCHARGE:                              HISTORY & PHYSICAL   This is an unassigned patient.   CHIEF COMPLAINT:  Chest pain, nausea, vomiting.   HISTORY OF PRESENT ILLNESS:  This is a 25 year old male with type 1  diabetes mellitus presenting to the emergency department with complaints  of continued mid-chest area chest pain, along with nausea and vomiting  for 2 days.  He also reports having polydipsia and polyuria.  He was  evaluated in the emergency department the day before, for the same  symptoms.  His electrolytes were checked.  He is administered IV insulin  and IV fluids, and his symptoms remitted.  He, however, returned  secondary to worsening symptoms, was found to have severe disorder of  his electrolytes and hyperglycemia with a glucose level of 449 and a  bicarb level of 4.4.  The patient was confused at the time of the  interview and was unable to give much of his medical history.  The  patient was treated with IV fluids and started on the IV insulin  protocol and referred for admission.  The patient reports beginning to  have diarrhea during the past 24 hours.  Denies having any fevers,  chills.  Denies hematemesis, melena, hematochezia.  He reports having  severe chest pain, describes it as being a 10/10 and is without  radiation.  He describes this as being a heaviness in his chest.  Cardizem enzymes had been performed earlier in the day and were  initially negative.  The patient was found to have a sinus tachycardia  on EKG.   PAST MEDICAL HISTORY:  Type 1 diabetes mellitus diagnosed at age 54.   MEDICATIONS:  Lantus insulin 30 units subcu daily and sliding scale  insulin coverage  p.r.n.   ALLERGIES:  NO KNOWN DRUG ALLERGIES.   SOCIAL HISTORY:  The patient works at a [employer] service [employer].  He is a  smoker, smokes one pack per day for 5 years.  No history of alcohol  usage.  No history of illicit drug usage.   FAMILY HISTORY:  Negative for coronary artery disease, diabetes  mellitus, hypertension and cancer.   REVIEW OF SYSTEMS:  Pertinent for mentioned above.   PHYSICAL EXAMINATION FINDINGS:  GENERAL:  This is a thin 25 year old  male in acute distress.  VITAL SIGNS:  Temperature 97.0, blood pressure 122/74, heart rate 120 to  130, respirations 22 to 24, O2 saturations 98% to 100%.  HEENT:  Normocephalic, atraumatic.  There is no scleral icterus.  Pupils equally  round, reactive to light.  Extraocular muscles are intact, funduscopic  benign.  Oropharynx is clear, mucosa dry.  NECK:  Supple, full range of motion.  No thyromegaly, adenopathy or  jugular venous distension.  CARDIOVASCULAR:  Tachycardiac rate and rhythm.  No murmurs, gallops or  rubs.  LUNGS:  Clear to auscultation bilaterally.  ABDOMEN:  Positive bowel sounds, soft, mildly tender in the epigastrium  area.  No guarding.  No rebound, no hepatosplenomegaly.  EXTREMITIES:  Without cyanosis, clubbing or edema.  NEUROLOGIC:  Mildly confused.  Speech, however is clear.  Cranial nerves  are intact.  There are no focal deficits.   LABORATORY STUDIES:  Sodium level 132, potassium 4.5, carbon dioxide 8,  chloride 99, glucose 449.  Urinalysis with greater than 1,000 glucose,  urine hemoglobin negative, total protein negative, urine nitrites  negative, leukocyte esterase negative, urine specific gravity 1.027,  urine pH 5.5, arterial blood gas pH 7.082, pCO2 15.2, pO2 117.0, bicarb  4.4, O2 saturations 97%.  CHEST X-RAY:  Negative for acute  cardiopulmonary disease.  Previous cardiac enzymes performed were  negative.   ASSESSMENT:  74. A 25 year old male presenting in acute diabetic ketoacidosis.  2.  Chest pain.  3. Abdominal pain.  4. Acute gastroenteritis.  5. Tobacco history.   PLAN:  The patient will be admitted to the ICU for cardiac monitoring  and continued treatment with the IV insulin drip via the Glucomander  insulin protocol.  IV fluids have been ordered for rehydration therapy.  Cardiac enzymes will also be performed q.8 h. x3.  Antiemetic therapy  has also been ordered as needed.  The patient will be n.p.o. while on  the IV insulin drip.  GI DVT prophylaxis have been ordered, and smoking  cessation will be will be addressed with the patient during the  hospitalization.      Della Goo, M.D.  Electronically Signed     Sean Richards/MEDQ  D:  09/05/2006  T:  09/05/2006  Job:  [id number]

## 2010-07-15 NOTE — H&P (Signed)
NAME:  Sean Richards, Sean Richards NO.:  [id number]   MEDICAL RECORD NO.:  [id number]          PATIENT TYPE:  EMS   LOCATION:  ED                           FACILITY:  [facility]   PHYSICIAN:  Michaelyn Barter, M.D. DATE OF BIRTH:  1985-08-01   DATE OF ADMISSION:  09/22/2005  DATE OF DISCHARGE:                                HISTORY & PHYSICAL   PRIMARY CARE PHYSICIAN:  None.   CHIEF COMPLAINT:  Nausea, vomiting and overall feeling bad.   HISTORY OF PRESENT ILLNESS:  Mr. Niznik is an 25 year old gentleman with a  past medical history of diabetes mellitus who states that yesterday morning  he started vomiting food from the prior night. He denies having any blood  present in his emesis.  He states that he had 2-3 episodes of emesis shortly  after he woke up in the morning. He went to his Mom's house to sleep, he  then came back home to his own place and threw up an additional 4-5 times.  His emesis consisted primarily of foods eaten, water and bilious appearing  substance.  He then decided to come to the ER for further evaluation.  Currently he complains of generalized aches and pains involving his chest,  his arms, and throughout other areas of his body.  He states that no other  individuals are sick at home. He denies having any diarrhea, he states that  he has been compliant with his medication.  He initially attributed his  symptoms in the morning to food poisoning. He stated that his brother had  cooked hamburgers however his brother does not appear to be ill.   PAST MEDICAL HISTORY:  Diabetes mellitus diagnosed at age 30.   PAST SURGICAL HISTORY:  None.   MEDICATIONS:  NovoLog Insulin. The patient is on an insulin pump, he has had  the pump present for 3-4 years.   SOCIAL HISTORY:  Cigarettes - the patient smokes one pack per day and  started at the age of 41.  Alcohol occasionally. Cocaine - never.   FAMILY HISTORY:  Mother - no illness that the patient can recall.   Father -  no illnesses.   PHYSICAL EXAMINATION:  GENERAL:  The patient is ill appearing,  lethargic/weak.  VITAL SIGNS:  Temperature 96.6, blood pressure 140/85, heart rate 121,  respirations 24, O2 saturation 99% on room air.  HEENT:  Normocephalic, atraumatic. Anicteric, pupils are sluggish to light  bilaterally. Oral mucosa is pink and dry. No thrush.  NECK:  Supple.  CARDIAC:  S1, S2 present, tachycardic, no gallops or murmurs or rubs.  RESPIRATORY:  No crackles or wheezes.  ABDOMEN:  Flat, soft, some tenderness to palpation, no rebound, no guarding.  Bowel sounds are present.  EXTREMITIES:  No leg edema.  NEUROLOGIC:  The patient is alert and oriented x3.  MUSCULOSKELETAL:  Upper and lower strength 5 out of 5.   LABORATORY DATA:  Sodium 124, potassium 4.4, chloride 95, CO2 9, BUN 19,  creatinine 1.2, glucose 349. Urinalysis - nitrites negative, leukocytes  small, ketones 40, protein 100.   ASSESSMENT AND PLAN:  1. Diabetic ketoacidosis. The  precipitating factor for the patient's      current DKA is questionable. The patient has already received      aggressive IV fluid hydration in the ER, will continue this. Will also      initiate a Glucomander and continue aggressive IV fluid hydration. Will      order basic metabolic profile checks q.2 hours for now.  The patient      does have an insulin pump present, the status of his pump, that is the      functioning of this pump is questionable at this particular time.  May      consider consulting endocrinology for evaluation of the patient's      insulin pump. Will also check blood cultures x2. The patient indicated      that he concerned that he may have eaten some bad food. Will also check      stool studies.  2. Anion gap metabolic acidosis. This is more than likely secondary to      DKA. Will perform all other studies as already outlined in number one.  3. Hyponatremia. This is more than likely secondary to the hyperglycemic       state.  It should correct itself as the patient's hyperglycemia/DKA      resolves.  4. GI prophylaxis. Will provide Protonix.      Michaelyn Barter, M.D.  Electronically Signed     OR/MEDQ  D:  09/22/2005  T:  09/22/2005  Job:  [id number]

## 2010-07-15 NOTE — Discharge Summary (Signed)
NAMEMARGUES, FILIPPINI          ACCOUNT NO.:  [id number]   MEDICAL RECORD NO.:  [id number]          PATIENT TYPE:  INP   LOCATION:  1612                         FACILITY:  [facility]   PHYSICIAN:  Elliot Cousin, M.D.    DATE OF BIRTH:  06-02-1985   DATE OF ADMISSION:  09/22/2005  DATE OF DISCHARGE:  09/25/2005                                 DISCHARGE SUMMARY   DISCHARGE DIAGNOSES:  1.  Diabetic ketoacidosis.  2.  Nausea and vomiting secondary to number one.  3.  Hypokalemia  4.  Hyponatremia.  5.  Tobacco abuse.   DISCHARGE MEDICATIONS:  1.  NovoLog sliding scale insulin before each meal and at bedtime as      directed.  2.  Lantus 35 units subcu q.h.s.   DISCHARGE DISPOSITION:  The patient was discharged home in improved and  stable condition on July13,2007.  He was advised to follow up with the  [facility] clinic in 2-3 days.   HISTORY OF PRESENT ILLNESS:  The patient is an 25 year old with a past  medical history significant for type 1 diabetes mellitus, who presented to  the emergency department, on July27,2007, with a chief complaint of not  feeling well, nausea, and vomiting.  When the patient was evaluated in the  emergency department, his lab data were significant for a bicarbonate level  of 9, a sodium level of 124, and a venous glucose of 349.  The patient was  admitted for DKA.   HOSPITAL COURSE:  1.  DIABETIC KETOACIDOSIS/HYPOKALEMIA/HYPONATREMIA/TYPE 1 DIABETES MELLITUS.      The patient was started on treatment with the insulin drip glucommander      protocol and aggressive IV fluids with normal saline.  Symptomatic      treatment was provided with as-needed Phenergan and Zofran for nausea      and vomiting.  A number of studies were ordered for further evaluation,      and they included an urinalysis, blood cultures, an ABG, blood acetone      level, and a hemoglobin A1c.  The patient's blood cultures were      initially negative and remained negative  throughout the hospitalization.      His urinalysis was essentially negative for any signs of infection.      However, it was positive for 30 of protein, greater than 80 of ketones,      and greater than 1000 of urine glucose.  His blood acetone level was      moderately elevated.  His A1c was quite elevated at 12.0.  His ABG on      admission revealed a pH of 7.037, pCO2 of 14.6, and a pO2 of 117.  Over      the course of the first several hours of hospitalization, his CO2      (bicarbonate level) actually fell to less than 5.  A decision was then      made to add bicarbonate to the IV fluids.  Over the course of the next      24 hours, the patient's acidosis resolved and his capillary blood sugars  improved.  The bicarbonate was eventually discontinued from the IV      fluids.  The glucommander insulin drip protocol was tapered off, and the      patient was started on a sliding scale insulin regimen with NovoLog q.4      h and basal Lantus insulin.  The control of the patient's glycemia waxed      and waned.  On the morning prior to hospital discharge, his capillary      blood sugars were in the 100s.  However, later on in the afternoon, his      venous glucose increased to over 500.  A decision, therefore, was made      to change the sliding scale insulin regimen to a resistant scale and to      increase the Lantus to 35 units subcu q.h.s.  Prior to hospital      discharge, however, he was given one dose of NPH.  Following this      treatment, the patient's venous glucose decreased from 541 to 284.  The      patient was, therefore, discharged to home with instructions to follow      the sliding scale insulin regimen as outlined along with Lantus 35 units      subcu q.h.s.   The patient was in agreement with discontinuing the insulin pump, as he  admitted to being noncompliant.  The patient was given information on the  [facility], and strongly advised to follow up with a  physician there  in 2-3 days following hospital discharge.  Of note, the patient was given a  brief counseling session by the registered dietician on meal planning and  the importance of compliance with insulin therapy and proper nutrition.   1.  HYPONATREMIA AND HYPOKALEMIA.  The patient's serum sodium was 124 on      admission, thought to be secondary to hyperglycemia and relative volume      depletion.  The patient's serum potassium was actually within normal      limits on admission.  Following IV fluid volume repletion, the patient's      serum sodium progressively improved to 137.  However, following insulin      therapy and improvement in the patient's glycemia, his serum potassium      progressively fell to 3.0.  The patient was repleted with potassium      chloride during hospitalization.  The  serum potassium  improved to      4.0, but it fell again to 3.4.   A prescription for potassium chloride 20 m.e.q daily for two weeks was  called in to [facility].      Elliot Cousin, M.D.  Electronically Signed     DF/MEDQ  D:  09/26/2005  T:  09/26/2005  Job:  [id number]

## 2010-09-21 ENCOUNTER — Encounter: Payer: Self-pay | Admitting: Family

## 2010-11-25 LAB — POCT I-STAT, CHEM 8
BUN: 19
Calcium, Ion: 1.17
Chloride: 100
Creatinine, Ser: 0.9
Glucose, Bld: 450 — ABNORMAL HIGH

## 2010-11-25 LAB — BLOOD GAS, VENOUS
Bicarbonate: 22.8
Patient temperature: 98.6
TCO2: 19.5
pH, Ven: 7.391 — ABNORMAL HIGH
pO2, Ven: 64.5 — ABNORMAL HIGH

## 2010-12-13 LAB — DIFFERENTIAL
Basophils Absolute: 0
Basophils Absolute: 0
Basophils Absolute: 0.1
Basophils Relative: 1
Basophils Relative: 1
Eosinophils Absolute: 0.1
Eosinophils Absolute: 0.3
Eosinophils Absolute: 0.3
Eosinophils Relative: 1
Eosinophils Relative: 4
Lymphocytes Relative: 10 — ABNORMAL LOW
Lymphs Abs: 1.4
Lymphs Abs: 2.7
Monocytes Absolute: 1.1 — ABNORMAL HIGH
Monocytes Absolute: 3.4 — ABNORMAL HIGH
Monocytes Relative: 10
Monocytes Relative: 10
Neutro Abs: 27.3 — ABNORMAL HIGH
Neutrophils Relative %: 61
Neutrophils Relative %: 65

## 2010-12-13 LAB — BLOOD GAS, ARTERIAL
Acid-base deficit: 17.2 — ABNORMAL HIGH
Acid-base deficit: 26 — ABNORMAL HIGH
Bicarbonate: 18.6 — ABNORMAL LOW
Bicarbonate: 9.4 — ABNORMAL LOW
O2 Saturation: 76.2
O2 Saturation: 97
O2 Saturation: 98.8
Patient temperature: 98.6
TCO2: 11.9
TCO2: 4.1
TCO2: 8.8
pCO2 arterial: 33.1 — ABNORMAL LOW
pCO2 arterial: 36.4
pH, Arterial: 7.205 — ABNORMAL LOW
pO2, Arterial: 135 — ABNORMAL HIGH
pO2, Arterial: 34 — CL
pO2, Arterial: 39.9 — CL

## 2010-12-13 LAB — CULTURE, BLOOD (ROUTINE X 2): Culture: NO GROWTH

## 2010-12-13 LAB — BASIC METABOLIC PANEL
BUN: 11
BUN: 11
BUN: 3 — ABNORMAL LOW
BUN: 5 — ABNORMAL LOW
BUN: 6
BUN: 7
BUN: 8
CO2: 12 — ABNORMAL LOW
CO2: 16 — ABNORMAL LOW
CO2: 21
CO2: 24
Calcium: 7.5 — ABNORMAL LOW
Calcium: 7.9 — ABNORMAL LOW
Calcium: 8.9
Chloride: 104
Chloride: 105
Chloride: 106
Chloride: 112
Chloride: 114 — ABNORMAL HIGH
Chloride: 93 — ABNORMAL LOW
Creatinine, Ser: 0.64
Creatinine, Ser: 0.74
Creatinine, Ser: 1.13
Creatinine, Ser: 1.26
Creatinine, Ser: 1.29
GFR calc non Af Amer: >60
GFR calc non Af Amer: >60
GFR calc non Af Amer: >60
GFR calc non Af Amer: >60
Glucose, Bld: 110 — ABNORMAL HIGH
Glucose, Bld: 118 — ABNORMAL HIGH
Glucose, Bld: 160 — ABNORMAL HIGH
Glucose, Bld: 176 — ABNORMAL HIGH
Glucose, Bld: 199 — ABNORMAL HIGH
Glucose, Bld: 335 — ABNORMAL HIGH
Glucose, Bld: 432 — ABNORMAL HIGH
Glucose, Bld: 449 — ABNORMAL HIGH
Potassium: 3.1 — ABNORMAL LOW
Potassium: 3.4 — ABNORMAL LOW
Potassium: 3.6
Potassium: 4.1
Potassium: 4.8
Sodium: 136
Sodium: 139
Sodium: 140

## 2010-12-13 LAB — CBC
HCT: 33.7 — ABNORMAL LOW
HCT: 48.5
Hemoglobin: 12.2 — ABNORMAL LOW
MCHC: 36
MCHC: 36
MCHC: 36.3 — ABNORMAL HIGH
MCV: 88.3
MCV: 89.5
MCV: 90.6
Platelets: 142 — ABNORMAL LOW
Platelets: 315
Platelets: 462 — ABNORMAL HIGH
RBC: 4.59
RDW: 13.6
RDW: 13.6
RDW: 13.7
WBC: 33.7 — ABNORMAL HIGH
WBC: 6.4

## 2010-12-13 LAB — URINALYSIS, ROUTINE W REFLEX MICROSCOPIC
Hgb urine dipstick: NEGATIVE
Leukocytes, UA: NEGATIVE
Nitrite: NEGATIVE
Protein, ur: NEGATIVE
Specific Gravity, Urine: 1.027
Urobilinogen, UA: 0.2

## 2010-12-13 LAB — PHOSPHORUS
Phosphorus: 2.8
Phosphorus: 2.9

## 2010-12-13 LAB — POCT CARDIAC MARKERS: Myoglobin, poc: 32.6

## 2010-12-13 LAB — TROPONIN I: Troponin I: 0.02

## 2010-12-13 LAB — HEMOGLOBIN A1C: Hgb A1c MFr Bld: 6

## 2010-12-13 LAB — KETONES, QUALITATIVE

## 2010-12-13 LAB — CARDIAC PANEL(CRET KIN+CKTOT+MB+TROPI)
CK, MB: 1.1
Relative Index: INVALID
Total CK: 40

## 2010-12-13 LAB — URINE MICROSCOPIC-ADD ON: Urine-Other: NONE SEEN

## 2015-10-07 ENCOUNTER — Encounter (HOSPITAL_BASED_OUTPATIENT_CLINIC_OR_DEPARTMENT_OTHER): Payer: Self-pay

## 2015-10-07 ENCOUNTER — Emergency Department (HOSPITAL_BASED_OUTPATIENT_CLINIC_OR_DEPARTMENT_OTHER)
Admission: EM | Admit: 2015-10-07 | Discharge: 2015-10-08 | Disposition: A | Discharge status: Home / Self Care | Payer: BLUE CROSS/BLUE SHIELD | Attending: Emergency Medicine | Admitting: Emergency Medicine

## 2015-10-07 DIAGNOSIS — F17210 Nicotine dependence, cigarettes, uncomplicated: Secondary | ICD-10-CM | POA: Diagnosis not present

## 2015-10-07 DIAGNOSIS — H538 Other visual disturbances: Secondary | ICD-10-CM | POA: Insufficient documentation

## 2015-10-07 DIAGNOSIS — R739 Hyperglycemia, unspecified: Secondary | ICD-10-CM

## 2015-10-07 DIAGNOSIS — E1065 Type 1 diabetes mellitus with hyperglycemia: Secondary | ICD-10-CM | POA: Insufficient documentation

## 2015-10-07 DIAGNOSIS — J029 Acute pharyngitis, unspecified: Secondary | ICD-10-CM | POA: Insufficient documentation

## 2015-10-07 LAB — URINALYSIS, ROUTINE W REFLEX MICROSCOPIC
Bilirubin Urine: NEGATIVE
Hgb urine dipstick: NEGATIVE
Ketones, ur: 40 mg/dL — AB
Leukocytes, UA: NEGATIVE
Nitrite: NEGATIVE
PH: 6.5 (ref 5.0–8.0)
Protein, ur: NEGATIVE mg/dL
SPECIFIC GRAVITY, URINE: 1.03 (ref 1.005–1.030)

## 2015-10-07 LAB — URINE MICROSCOPIC-ADD ON
Squamous Epithelial / LPF: NONE SEEN
WBC, UA: NONE SEEN WBC/hpf (ref 0–5)

## 2015-10-07 LAB — BASIC METABOLIC PANEL
Anion gap: 9 (ref 5–15)
BUN: 17 mg/dL (ref 6–20)
CHLORIDE: 98 mmol/L — AB (ref 101–111)
CO2: 24 mmol/L (ref 22–32)
Calcium: 9.5 mg/dL (ref 8.9–10.3)
Creatinine, Ser: 0.91 mg/dL (ref 0.61–1.24)
GFR calc Af Amer: >60 mL/min (ref >60)
GFR calc non Af Amer: >60 mL/min (ref >60)
Glucose, Bld: 554 mg/dL (ref 65–99)
POTASSIUM: 4.3 mmol/L (ref 3.5–5.1)
SODIUM: 131 mmol/L — AB (ref 135–145)

## 2015-10-07 LAB — I-STAT VENOUS BLOOD GAS, ED
ACID-BASE DEFICIT: 1 mmol/L (ref 0.0–2.0)
BICARBONATE: 24.3 meq/L — AB (ref 20.0–24.0)
O2 Saturation: 64 %
TCO2: 26 mmol/L (ref 0–100)
pCO2, Ven: 42.6 mmHg — ABNORMAL LOW (ref 45.0–50.0)
pH, Ven: 7.365 — ABNORMAL HIGH (ref 7.250–7.300)
pO2, Ven: 35 mmHg (ref 31.0–45.0)

## 2015-10-07 LAB — CBG MONITORING, ED
GLUCOSE-CAPILLARY: 538 mg/dL — AB (ref 65–99)
Glucose-Capillary: 392 mg/dL — ABNORMAL HIGH (ref 65–99)

## 2015-10-07 LAB — RAPID STREP SCREEN (MED CTR MEBANE ONLY): STREPTOCOCCUS, GROUP A SCREEN (DIRECT): NEGATIVE

## 2015-10-07 LAB — CBC
HEMATOCRIT: 40.1 % (ref 39.0–52.0)
HEMOGLOBIN: 14.2 g/dL (ref 13.0–17.0)
MCH: 28.9 pg (ref 26.0–34.0)
MCHC: 35.4 g/dL (ref 30.0–36.0)
MCV: 81.7 fL (ref 78.0–100.0)
Platelets: 292 10*3/uL (ref 150–400)
RBC: 4.91 MIL/uL (ref 4.22–5.81)
RDW: 13.2 % (ref 11.5–15.5)
WBC: 7.6 10*3/uL (ref 4.0–10.5)

## 2015-10-07 MED ORDER — SODIUM CHLORIDE 0.9 % IV BOLUS (SEPSIS)
1000.0000 mL | Freq: Once | INTRAVENOUS | Status: AC
  Administered 2015-10-08: 1000 mL via INTRAVENOUS

## 2015-10-07 MED ORDER — SODIUM CHLORIDE 0.9 % IV BOLUS (SEPSIS)
1000.0000 mL | Freq: Once | INTRAVENOUS | Status: AC
  Administered 2015-10-07: 1000 mL via INTRAVENOUS

## 2015-10-07 MED ORDER — INSULIN REGULAR HUMAN 100 UNIT/ML IJ SOLN
10.0000 [IU] | Freq: Once | INTRAMUSCULAR | Status: AC
  Administered 2015-10-08: 10 [IU] via INTRAVENOUS
  Filled 2015-10-07: qty 1

## 2015-10-07 MED ORDER — INSULIN REGULAR HUMAN 100 UNIT/ML IJ SOLN
10.0000 [IU] | Freq: Once | INTRAMUSCULAR | Status: AC
  Administered 2015-10-07: 10 [IU] via INTRAVENOUS
  Filled 2015-10-07: qty 1

## 2015-10-07 MED ORDER — SODIUM CHLORIDE 0.9 % IV SOLN
INTRAVENOUS | Status: DC
  Filled 2015-10-07: qty 2.5

## 2015-10-07 NOTE — ED Provider Notes (Signed)
[facility] Note   CSN: [id number] Arrival date & time: 10/07/15  2146  By signing my name below, I, [name], attest that this documentation has been prepared under the direction and in the presence of Audry Pili, PA-C. Electronically Signed: [name], ED Scribe. 10/07/15. 10:18 PM.  First MD Initiated Contact with Patient 10/07/15 2210    History   Chief Complaint Chief Complaint  Patient presents with  . Hyperglycemia    HPI Sean Richards is a 30 y.o. male.  The history is provided by the patient. No language interpreter was used.    HPI Comments: Sean Richards is a 30 y.o. male who presents to the Emergency Department complaining of gradual onset, constant, persistent hyperglycemia onset 2 days. Pt reports associated headache, blurry vision, sore throat, congestion. Pt states he checked his blood sugar and his machine read "hi", but did not specify how high. Pt states he is on Insulin and his last dose was 12 units taken at 7 PM. Pt states he has been hospitalized numerous times for DKA and that his last DKA was 4-5 years ago. Pt denies nausea, vomiting, ear pain, shortness of breath.  Past Medical History:  Diagnosis Date  . Childhood asthma   . Diabetes mellitus type I (HCC)   . YQMVHQIO(962.9)     Patient Active Problem List   Diagnosis Date Noted  . 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 BURSITIS, LEFT KNEE 03/01/2010  . DIABETES MELLITUS, TYPE I 07/09/2009  . TOBACCO ABUSE 07/09/2009  . ASTHMA 07/09/2009  . HEADACHE 07/09/2009    Past Surgical History:  Procedure Laterality Date  . HERNIA REPAIR       Home Medications    Prior to Admission medications   Medication Sig Start Date End Date Taking? Authorizing Provider  buPROPion (WELLBUTRIN XL) 150 MG 24 hr tablet Take 150 mg by mouth daily.      Historical Provider, MD  glucose blood (ONE TOUCH TEST STRIPS) test strip Use to check blood sugar four times daily      Historical Provider, MD  insulin glargine (LANTUS SOLOSTAR) 100 UNIT/ML injection Inject 20-40 Units into the skin at bedtime.      Historical Provider, MD  insulin glulisine (APIDRA) 100 UNIT/ML injection Inject 10-30 Units into the skin. Three times a day as directed     Historical Provider, MD  Insulin Pen Needle (PEN NEEDLES 31GX5/16") 31G X 8 MM MISC Use four times a day for insulin injection     Historical Provider, MD    Family History Family History  Problem Relation Age of Onset  . Arthritis    . Breast cancer Paternal Grandmother   . Hypertension Mother   . Diabetes Mother     borderline  . Hypertension Brother   . Stroke Maternal Grandmother   . Diabetes type II Sister   . Other      no family history of colon cancer or prostate cancer    Social History Social History  Substance Use Topics  . Smoking status: Current Every Day Smoker    Packs/day: 1.50    Types: Cigarettes  . Smokeless tobacco: Not on file     Comment: smoking since the age of 25  . Alcohol use Yes     Comment: social     Allergies   Review of patient's allergies indicates no known allergies.   Review of Systems Review of Systems A complete 10 system review of systems was obtained  and all systems are negative except as noted in the HPI and PMH.   Physical Exam Updated Vital Signs BP 142/82 (BP Location: Right Arm)   Pulse 100   Temp 97.7 F (36.5 C) (Oral)   Resp 20   Ht 6' (1.829 m)   Wt 240 lb (108.9 kg)   SpO2 100%   BMI 32.55 kg/m   Physical Exam  Constitutional: He appears well-developed and well-nourished.  HENT:  Head: Normocephalic.  Mouth/Throat: Posterior oropharyngeal erythema present. No oropharyngeal exudate.  Eyes: Conjunctivae are normal.  Cardiovascular: Normal rate, regular rhythm and normal heart sounds.   Pulmonary/Chest: Effort normal and breath sounds normal. No respiratory distress. He has no wheezes. He has no rales.  Abdominal: Soft. He exhibits no  distension. There is no tenderness.  Musculoskeletal: Normal range of motion.  Neurological: He is alert.  Skin: Skin is warm and dry.  Psychiatric: He has a normal mood and affect. His behavior is normal.  Nursing note and vitals reviewed.  ED Treatments / Results  DIAGNOSTIC STUDIES: Oxygen Saturation is 100% on RA, normal by my interpretation.    COORDINATION OF CARE: 10:15 PM Discussed treatment plan with pt at bedside which includes blood work and pt agreed to plan.  Labs (all labs ordered are listed, but only abnormal results are displayed) Labs Reviewed  URINALYSIS, ROUTINE W REFLEX MICROSCOPIC (NOT AT [facility]) - Abnormal; Notable for the following:       Result Value   Glucose, UA >1000 (*)    Ketones, ur 40 (*)    All other components within normal limits  BASIC METABOLIC PANEL - Abnormal; Notable for the following:    Sodium 131 (*)    Chloride 98 (*)    Glucose, Bld 554 (*)    All other components within normal limits  URINE MICROSCOPIC-ADD ON - Abnormal; Notable for the following:    Bacteria, UA RARE (*)    All other components within normal limits  CBG MONITORING, ED - Abnormal; Notable for the following:    Glucose-Capillary 538 (*)    All other components within normal limits  I-STAT VENOUS BLOOD GAS, ED - Abnormal; Notable for the following:    pH, Ven 7.365 (*)    pCO2, Ven 42.6 (*)    Bicarbonate 24.3 (*)    All other components within normal limits  CBG MONITORING, ED - Abnormal; Notable for the following:    Glucose-Capillary 392 (*)    All other components within normal limits  RAPID STREP SCREEN (NOT AT [facility])  CULTURE, GROUP A STREP ([facility])  CBC  BLOOD GAS, VENOUS    EKG  EKG Interpretation None      Radiology No results found.  Procedures Procedures (including critical care time)  Medications Ordered in ED Medications  sodium chloride 0.9 % bolus 1,000 mL (not administered)   Initial Impression / Assessment and Plan / ED Course  I  have reviewed the triage vital signs and the nursing notes.  Pertinent labs & imaging results that were available during my care of the patient were reviewed by me and considered in my medical decision making (see chart for details).  Clinical Course    Final Clinical Impressions(s) / ED Diagnoses  I have reviewed and evaluated the relevant laboratory valuesI have reviewed and evaluated the relevant imaging studies. I have reviewed the relevant previous healthcare records. I obtained HPI from historian.  ED Course:  Assessment: Pt is a 29yM with hx DM who presents  with hyperglycemia x 2 days. Has headache, which is typical with glucose running high. States recent sore throat and attributes hyperglycemia due to sickness. On exam, pt in NAD. Nontoxic/nonseptic appearing. VSS. Afebrile. Lungs CTA. Heart RRR. Abdomen nontender soft. CBG 538. CBC unremarkable. Potassium 4.3. Anion Gap 9. UA with ketones present. VBG unremarkable. Rapid strep negative. Given NS bolus and insulin. Plan is to DC Home with follow up to PCP. At time of discharge, Patient is in no acute distress. Vital Signs are stable. Patient is able to ambulate. Patient able to tolerate PO.    Disposition/Plan:  DC Home Additional Verbal discharge instructions given and discussed with patient.  Pt Instructed to f/u with PCP in the next week for evaluation and treatment of symptoms. Return precautions given Pt acknowledges and agrees with plan  Supervising Physician Nelva Nay, MD   Final diagnoses:  Hyperglycemia    New Prescriptions New Prescriptions   No medications on file   I personally performed the services described in this documentation, which was scribed in my presence. The recorded information has been reviewed and is accurate.     Audry Pili, PA-C 10/08/15 0112    Nelva Nay, MD 10/08/15 [phone]

## 2015-10-07 NOTE — ED Notes (Signed)
FSBG is 538 currently, pt states he has been hospitalized numerous times for DKA

## 2015-10-07 NOTE — ED Triage Notes (Signed)
Pt c/o sugars in the 300's over the last few days that he has been able to control with insulin injections.  Tonight, he started having a headache, blurred vision, increased thirst and urination and his glucometer read "hi."  Pt states the last insulin injection he had was at 1900 and it was 12U of novolog.

## 2015-10-08 LAB — CBG MONITORING, ED: GLUCOSE-CAPILLARY: 226 mg/dL — AB (ref 65–99)

## 2015-10-08 NOTE — Discharge Instructions (Signed)
Please read and follow all provided instructions.  Your diagnoses today include:  1. Hyperglycemia    Tests performed today include: Vital signs. See below for your results today.   Medications prescribed:  Take as prescribed   Home care instructions:  Follow any educational materials contained in this packet.  Follow-up instructions: Please follow-up with your primary care provider for further evaluation of symptoms and treatment   Return instructions:  Please return to the Emergency Department if you do not get better, if you get worse, or new symptoms OR  - Fever (temperature greater than 101.28F)  - Bleeding that does not stop with holding pressure to the area    -Severe pain (please note that you may be more sore the day after your accident)  - Chest Pain  - Difficulty breathing  - Severe nausea or vomiting  - Inability to tolerate food and liquids  - Passing out  - Skin becoming red around your wounds  - Change in mental status (confusion or lethargy)  - New numbness or weakness    Please return if you have any other emergent concerns.  Additional Information:  Your vital signs today were: BP 135/81    Pulse 85    Temp 97.7 F (36.5 C) (Oral)    Resp 18    Ht 6' (1.829 m)    Wt 108.9 kg    SpO2 100%    BMI 32.55 kg/m  If your blood pressure (BP) was elevated above 135/85 this visit, please have this repeated by your doctor within one month. ---------------

## 2015-10-10 LAB — CULTURE, GROUP A STREP (THRC)

## 2017-05-16 ENCOUNTER — Other Ambulatory Visit: Payer: Self-pay

## 2017-05-16 ENCOUNTER — Encounter (HOSPITAL_BASED_OUTPATIENT_CLINIC_OR_DEPARTMENT_OTHER): Payer: Self-pay | Admitting: Emergency Medicine

## 2017-05-16 ENCOUNTER — Emergency Department (HOSPITAL_BASED_OUTPATIENT_CLINIC_OR_DEPARTMENT_OTHER)
Admission: EM | Admit: 2017-05-16 | Discharge: 2017-05-16 | Disposition: A | Discharge status: Home / Self Care | Payer: BLUE CROSS/BLUE SHIELD | Attending: Emergency Medicine | Admitting: Emergency Medicine

## 2017-05-16 DIAGNOSIS — R197 Diarrhea, unspecified: Secondary | ICD-10-CM | POA: Diagnosis not present

## 2017-05-16 DIAGNOSIS — A084 Viral intestinal infection, unspecified: Secondary | ICD-10-CM | POA: Insufficient documentation

## 2017-05-16 DIAGNOSIS — F17210 Nicotine dependence, cigarettes, uncomplicated: Secondary | ICD-10-CM | POA: Insufficient documentation

## 2017-05-16 DIAGNOSIS — R109 Unspecified abdominal pain: Secondary | ICD-10-CM | POA: Diagnosis not present

## 2017-05-16 DIAGNOSIS — R112 Nausea with vomiting, unspecified: Secondary | ICD-10-CM | POA: Diagnosis present

## 2017-05-16 DIAGNOSIS — Z794 Long term (current) use of insulin: Secondary | ICD-10-CM | POA: Diagnosis not present

## 2017-05-16 DIAGNOSIS — E109 Type 1 diabetes mellitus without complications: Secondary | ICD-10-CM | POA: Diagnosis not present

## 2017-05-16 LAB — CBC WITH DIFFERENTIAL/PLATELET
BASOS ABS: 0 10*3/uL (ref 0.0–0.1)
Basophils Relative: 0 %
EOS PCT: 1 %
Eosinophils Absolute: 0.2 10*3/uL (ref 0.0–0.7)
HCT: 42.3 % (ref 39.0–52.0)
Hemoglobin: 15.3 g/dL (ref 13.0–17.0)
Lymphocytes Relative: 10 %
Lymphs Abs: 1.4 10*3/uL (ref 0.7–4.0)
MCH: 29.9 pg (ref 26.0–34.0)
MCHC: 36.2 g/dL — ABNORMAL HIGH (ref 30.0–36.0)
MCV: 82.6 fL (ref 78.0–100.0)
Monocytes Absolute: 0.8 10*3/uL (ref 0.1–1.0)
Monocytes Relative: 6 %
Neutro Abs: 11.5 10*3/uL — ABNORMAL HIGH (ref 1.7–7.7)
Neutrophils Relative %: 83 %
PLATELETS: 326 10*3/uL (ref 150–400)
RBC: 5.12 MIL/uL (ref 4.22–5.81)
RDW: 13.6 % (ref 11.5–15.5)
WBC: 13.9 10*3/uL — AB (ref 4.0–10.5)

## 2017-05-16 LAB — COMPREHENSIVE METABOLIC PANEL
ALT: 26 U/L (ref 17–63)
AST: 25 U/L (ref 15–41)
Albumin: 4.6 g/dL (ref 3.5–5.0)
Alkaline Phosphatase: 103 U/L (ref 38–126)
Anion gap: 11 (ref 5–15)
BILIRUBIN TOTAL: 0.9 mg/dL (ref 0.3–1.2)
BUN: 18 mg/dL (ref 6–20)
CO2: 25 mmol/L (ref 22–32)
CREATININE: 0.8 mg/dL (ref 0.61–1.24)
Calcium: 9.3 mg/dL (ref 8.9–10.3)
Chloride: 105 mmol/L (ref 101–111)
GFR calc Af Amer: >60 mL/min (ref >60)
Glucose, Bld: 189 mg/dL — ABNORMAL HIGH (ref 65–99)
Potassium: 3.2 mmol/L — ABNORMAL LOW (ref 3.5–5.1)
Sodium: 141 mmol/L (ref 135–145)
TOTAL PROTEIN: 8.2 g/dL — AB (ref 6.5–8.1)

## 2017-05-16 LAB — I-STAT VENOUS BLOOD GAS, ED
Acid-Base Excess: 1 mmol/L (ref 0.0–2.0)
Bicarbonate: 27.6 mmol/L (ref 20.0–28.0)
O2 Saturation: 33 %
TCO2: 29 mmol/L (ref 22–32)
pCO2, Ven: 50.9 mmHg (ref 44.0–60.0)
pH, Ven: 7.34 (ref 7.250–7.430)
pO2, Ven: 22 mmHg — CL (ref 32.0–45.0)

## 2017-05-16 LAB — LIPASE, BLOOD: Lipase: 26 U/L (ref 11–51)

## 2017-05-16 LAB — CBG MONITORING, ED: Glucose-Capillary: 180 mg/dL — ABNORMAL HIGH (ref 65–99)

## 2017-05-16 MED ORDER — ONDANSETRON HCL 4 MG/2ML IJ SOLN: 4.0000 mg | Freq: Once | INTRAMUSCULAR | Status: DC

## 2017-05-16 MED ORDER — ONDANSETRON 8 MG PO TBDP: 8.0000 mg | ORAL_TABLET | Freq: Three times a day (TID) | ORAL | 0 refills | Status: DC | PRN

## 2017-05-16 MED ORDER — ONDANSETRON HCL 4 MG/2ML IJ SOLN
4.0000 mg | Freq: Once | INTRAMUSCULAR | Status: AC | PRN
  Administered 2017-05-16: 4 mg via INTRAVENOUS
  Filled 2017-05-16: qty 2

## 2017-05-16 MED ORDER — LOPERAMIDE HCL 2 MG PO CAPS
4.0000 mg | ORAL_CAPSULE | Freq: Once | ORAL | Status: AC
Start: 2017-05-16 — End: 2017-05-16
  Administered 2017-05-16: 4 mg via ORAL
  Filled 2017-05-16: qty 2

## 2017-05-16 MED ORDER — SODIUM CHLORIDE 0.9 % IV BOLUS (SEPSIS)
1000.0000 mL | Freq: Once | INTRAVENOUS | Status: AC
  Administered 2017-05-16: 1000 mL via INTRAVENOUS

## 2017-05-16 NOTE — ED Notes (Signed)
Pt c/o n/v/d that started tonight, apparently, his entire house has the same thing and it started this morning with his youngest child.

## 2017-05-16 NOTE — ED Provider Notes (Signed)
[facility] Provider Note: Sean Dell, MD, FACEP  CSN: [id number] MRN: [id number] ARRIVAL: 05/16/17 at 0059 ROOM: MH09/MH09   CHIEF COMPLAINT  Vomiting   HISTORY OF PRESENT ILLNESS  05/16/17 3:31 AM Sean Richards is a 32 y.o. male with type 1 diabetes.  He is here with nausea, vomiting and diarrhea since about midnight.  There was associated abdominal pain which she rated as an 8 out of 10 but that is significantly improved.  He has had recent sick contacts with similar symptoms.  He was unable to keep anything on his stomach prior to arrival.  He has had diarrhea since arrival.  He has a history of DKA.  He was given an IV fluid bolus and Zofran prior to my evaluation with improvement in his nausea.  His CBG on arrival was 180.    Past Medical History:  Diagnosis Date  . Childhood asthma   . Diabetes mellitus type I (HCC)   . NWGNFAOZ(308.6)     Past Surgical History:  Procedure Laterality Date  . HERNIA REPAIR      Family History  Problem Relation Age of Onset  . Arthritis Unknown   . Breast cancer Paternal Grandmother   . Hypertension Mother   . Diabetes Mother        borderline  . Hypertension Brother   . Stroke Maternal Grandmother   . Diabetes type II Sister   . Other Unknown        no family history of colon cancer or prostate cancer    Social History   Tobacco Use  . Smoking status: Current Every Day Smoker    Packs/day: 1.50    Types: Cigarettes  . Smokeless tobacco: Never Used  . Tobacco comment: smoking since the age of 61  Substance Use Topics  . Alcohol use: Yes    Comment: social  . Drug use: No    Prior to Admission medications   Medication Sig Start Date End Date Taking? Authorizing Provider  buPROPion (WELLBUTRIN XL) 150 MG 24 hr tablet Take 150 mg by mouth daily.      [provider]  glucose blood (ONE TOUCH TEST STRIPS) test strip Use to check blood sugar four times daily     [provider]    insulin glargine (LANTUS SOLOSTAR) 100 UNIT/ML injection Inject 20-40 Units into the skin at bedtime.      [provider]  insulin glulisine (APIDRA) 100 UNIT/ML injection Inject 10-30 Units into the skin. Three times a day as directed     [provider]  Insulin Pen Needle (PEN NEEDLES 31GX5/16") 31G X 8 MM MISC Use four times a day for insulin injection     [provider]    Allergies Patient has no known allergies.   REVIEW OF SYSTEMS  Negative except as noted here or in the History of Present Illness.   PHYSICAL EXAMINATION  Initial Vital Signs Blood pressure 131/90, pulse (!) 108, temperature 98 F (36.7 C), temperature source Oral, resp. rate 18, height 6' (1.829 m), weight 115.7 kg (255 lb), SpO2 99 %.  Examination General: Well-developed, well-nourished male in no acute distress; appearance consistent with age of record HENT: normocephalic; atraumatic Eyes: pupils equal, round and reactive to light; extraocular muscles intact Neck: supple Heart: regular rate and rhythm Lungs: clear to auscultation bilaterally Abdomen: soft; nondistended; mild diffuse tenderness; no masses or hepatosplenomegaly; bowel sounds present Extremities: No deformity; full Richards of motion; pulses normal Neurologic:  Awake, alert and oriented; motor function intact in all extremities and symmetric; no facial droop Skin: Warm and dry Psychiatric: Flat affect   RESULTS  Summary of this visit's results, reviewed by myself:   EKG Interpretation  Date/Time:    Ventricular Rate:    PR Interval:    QRS Duration:   QT Interval:    QTC Calculation:   R Axis:     Text Interpretation:        Laboratory Studies: Results for orders placed or performed during the hospital encounter of 05/16/17 (from the past 24 hour(s))  CBG monitoring, ED     Status: Abnormal   Collection Time: 05/16/17  1:07 AM  Result Value Ref Richards   Glucose-Capillary 180 (H) 65 - 99 mg/dL   Lipase, blood     Status: None   Collection Time: 05/16/17  1:27 AM  Result Value Ref Richards   Lipase 26 11 - 51 U/L  Comprehensive metabolic panel     Status: Abnormal   Collection Time: 05/16/17  1:27 AM  Result Value Ref Richards   Sodium 141 135 - 145 mmol/L   Potassium 3.2 (L) 3.5 - 5.1 mmol/L   Chloride 105 101 - 111 mmol/L   CO2 25 22 - 32 mmol/L   Glucose, Bld 189 (H) 65 - 99 mg/dL   BUN 18 6 - 20 mg/dL   Creatinine, Ser 1.61 0.61 - 1.24 mg/dL   Calcium 9.3 8.9 - 09.6 mg/dL   Total Protein 8.2 (H) 6.5 - 8.1 g/dL   Albumin 4.6 3.5 - 5.0 g/dL   AST 25 15 - 41 U/L   ALT 26 17 - 63 U/L   Alkaline Phosphatase 103 38 - 126 U/L   Total Bilirubin 0.9 0.3 - 1.2 mg/dL   GFR calc non Af Amer >60 >60 mL/min   GFR calc Af Amer >60 >60 mL/min   Anion gap 11 5 - 15  CBC with Differential     Status: Abnormal   Collection Time: 05/16/17  1:27 AM  Result Value Ref Richards   WBC 13.9 (H) 4.0 - 10.5 K/uL   RBC 5.12 4.22 - 5.81 MIL/uL   Hemoglobin 15.3 13.0 - 17.0 g/dL   HCT 04.5 40.9 - 81.1 %   MCV 82.6 78.0 - 100.0 fL   MCH 29.9 26.0 - 34.0 pg   MCHC 36.2 (H) 30.0 - 36.0 g/dL   RDW 91.4 78.2 - 95.6 %   Platelets 326 150 - 400 K/uL   Neutrophils Relative % 83 %   Neutro Abs 11.5 (H) 1.7 - 7.7 K/uL   Lymphocytes Relative 10 %   Lymphs Abs 1.4 0.7 - 4.0 K/uL   Monocytes Relative 6 %   Monocytes Absolute 0.8 0.1 - 1.0 K/uL   Eosinophils Relative 1 %   Eosinophils Absolute 0.2 0.0 - 0.7 K/uL   Basophils Relative 0 %   Basophils Absolute 0.0 0.0 - 0.1 K/uL  I-Stat venous blood gas, ED     Status: Abnormal   Collection Time: 05/16/17  1:45 AM  Result Value Ref Richards   pH, Ven 7.340 7.250 - 7.430   pCO2, Ven 50.9 44.0 - 60.0 mmHg   pO2, Ven 22.0 (LL) 32.0 - 45.0 mmHg   Bicarbonate 27.6 20.0 - 28.0 mmol/L   TCO2 29 22 - 32 mmol/L   O2 Saturation 33.0 %   Acid-Base Excess 1.0 0.0 - 2.0 mmol/L   Patient temperature 98.0 F    Collection  site IV START    Drawn by RT    Sample type  VENOUS    Comment NOTIFIED PHYSICIAN    Imaging Studies: No results found.  ED COURSE  Nursing notes and initial vitals signs, including pulse oximetry, reviewed.  Vitals:   05/16/17 0105 05/16/17 0106 05/16/17 0338  BP: 131/90  137/83  Pulse: (!) 108  96  Resp: 18  16  Temp: 98 F (36.7 C)    TempSrc: Oral    SpO2: 99%  96%  Weight:  115.7 kg (255 lb)   Height:  6' (1.829 m)    4:08 AM Patient feeling better after 2 L of normal saline IV.  Patient able to drink fluids without emesis.  History consistent with viral gastroenteritis given recent sick contacts.  No evidence of diabetic ketoacidosis.  PROCEDURES    ED DIAGNOSES     ICD-10-CM   1. Viral gastroenteritis A08.4        Ashawnti Tangen, MD 05/16/17 [phone]

## 2017-05-16 NOTE — ED Notes (Signed)
ED Provider at bedside. 

## 2017-05-16 NOTE — ED Triage Notes (Signed)
Patient states that he has had N/V/D since about midnight. He is a type 1 diabetic and he is concerned about the fact that he cant get anything down for his BS  - His blood sugar per his free style is 214

## 2017-05-17 ENCOUNTER — Other Ambulatory Visit: Payer: Self-pay

## 2017-05-17 ENCOUNTER — Emergency Department (HOSPITAL_BASED_OUTPATIENT_CLINIC_OR_DEPARTMENT_OTHER)
Admission: EM | Admit: 2017-05-17 | Discharge: 2017-05-18 | Disposition: A | Discharge status: Home / Self Care | Payer: BLUE CROSS/BLUE SHIELD | Attending: Emergency Medicine | Admitting: Emergency Medicine

## 2017-05-17 ENCOUNTER — Emergency Department (HOSPITAL_BASED_OUTPATIENT_CLINIC_OR_DEPARTMENT_OTHER): Payer: BLUE CROSS/BLUE SHIELD

## 2017-05-17 ENCOUNTER — Encounter (HOSPITAL_BASED_OUTPATIENT_CLINIC_OR_DEPARTMENT_OTHER): Payer: Self-pay | Admitting: *Deleted

## 2017-05-17 DIAGNOSIS — Z79899 Other long term (current) drug therapy: Secondary | ICD-10-CM | POA: Diagnosis not present

## 2017-05-17 DIAGNOSIS — J45909 Unspecified asthma, uncomplicated: Secondary | ICD-10-CM | POA: Insufficient documentation

## 2017-05-17 DIAGNOSIS — R079 Chest pain, unspecified: Secondary | ICD-10-CM | POA: Diagnosis present

## 2017-05-17 DIAGNOSIS — R05 Cough: Secondary | ICD-10-CM | POA: Insufficient documentation

## 2017-05-17 DIAGNOSIS — R112 Nausea with vomiting, unspecified: Secondary | ICD-10-CM | POA: Diagnosis not present

## 2017-05-17 DIAGNOSIS — E109 Type 1 diabetes mellitus without complications: Secondary | ICD-10-CM | POA: Insufficient documentation

## 2017-05-17 DIAGNOSIS — R072 Precordial pain: Secondary | ICD-10-CM | POA: Diagnosis not present

## 2017-05-17 DIAGNOSIS — Z794 Long term (current) use of insulin: Secondary | ICD-10-CM | POA: Insufficient documentation

## 2017-05-17 MED ORDER — SODIUM CHLORIDE 0.9 % IV BOLUS (SEPSIS)
1000.0000 mL | Freq: Once | INTRAVENOUS | Status: AC
  Administered 2017-05-17: 1000 mL via INTRAVENOUS

## 2017-05-17 MED ORDER — KETOROLAC TROMETHAMINE 30 MG/ML IJ SOLN
15.0000 mg | Freq: Once | INTRAMUSCULAR | Status: AC
  Administered 2017-05-17: 15 mg via INTRAVENOUS
  Filled 2017-05-17: qty 1

## 2017-05-17 NOTE — ED Notes (Signed)
Patient transported to X-ray 

## 2017-05-17 NOTE — ED Triage Notes (Signed)
Sharp chest pain today when he takes a deep breath. States he was seen 2 days ago for vomiting and diagnosed with gastroenteritis. Headache, dizziness,

## 2017-05-18 LAB — CBC WITH DIFFERENTIAL/PLATELET
BASOS PCT: 0 %
Basophils Absolute: 0 10*3/uL (ref 0.0–0.1)
Eosinophils Absolute: 0.4 10*3/uL (ref 0.0–0.7)
Eosinophils Relative: 5 %
HCT: 34.9 % — ABNORMAL LOW (ref 39.0–52.0)
HEMOGLOBIN: 12.5 g/dL — AB (ref 13.0–17.0)
LYMPHS ABS: 2.1 10*3/uL (ref 0.7–4.0)
Lymphocytes Relative: 28 %
MCH: 30.4 pg (ref 26.0–34.0)
MCHC: 35.8 g/dL (ref 30.0–36.0)
MCV: 84.9 fL (ref 78.0–100.0)
MONO ABS: 0.8 10*3/uL (ref 0.1–1.0)
MONOS PCT: 11 %
Neutro Abs: 4.1 10*3/uL (ref 1.7–7.7)
Neutrophils Relative %: 56 %
Platelets: 215 10*3/uL (ref 150–400)
RBC: 4.11 MIL/uL — ABNORMAL LOW (ref 4.22–5.81)
RDW: 13.4 % (ref 11.5–15.5)
WBC: 7.4 10*3/uL (ref 4.0–10.5)

## 2017-05-18 LAB — RAPID URINE DRUG SCREEN, HOSP PERFORMED
AMPHETAMINES: NOT DETECTED
BARBITURATES: NOT DETECTED
Benzodiazepines: NOT DETECTED
Cocaine: NOT DETECTED
OPIATES: NOT DETECTED
TETRAHYDROCANNABINOL: NOT DETECTED

## 2017-05-18 LAB — BASIC METABOLIC PANEL
Anion gap: 9 (ref 5–15)
BUN: 16 mg/dL (ref 6–20)
CO2: 26 mmol/L (ref 22–32)
CREATININE: 0.91 mg/dL (ref 0.61–1.24)
Calcium: 8.3 mg/dL — ABNORMAL LOW (ref 8.9–10.3)
Chloride: 102 mmol/L (ref 101–111)
GFR calc non Af Amer: >60 mL/min (ref >60)
GLUCOSE: 158 mg/dL — AB (ref 65–99)
Potassium: 3.4 mmol/L — ABNORMAL LOW (ref 3.5–5.1)
Sodium: 137 mmol/L (ref 135–145)

## 2017-05-18 LAB — TROPONIN I
Troponin I: <0.03 ng/mL (ref <0.03)
Troponin I: <0.03 ng/mL (ref <0.03)

## 2017-05-18 MED ORDER — GI COCKTAIL ~~LOC~~
30.0000 mL | Freq: Once | ORAL | Status: AC
  Administered 2017-05-18: 30 mL via ORAL
  Filled 2017-05-18: qty 30

## 2017-05-18 MED ORDER — PANTOPRAZOLE SODIUM 20 MG PO TBEC: 20.0000 mg | DELAYED_RELEASE_TABLET | Freq: Every day | ORAL | 0 refills | Status: AC

## 2017-05-18 NOTE — ED Provider Notes (Signed)
[facility] EMERGENCY DEPARTMENT Provider Note   CSN: [id number] Arrival date & time: 05/17/17  2152     History   Chief Complaint Chief Complaint  Patient presents with  . Chest Pain    HPI Sean Richards is a 32 y.o. male.  The history is provided by the patient.  Chest Pain   This is a new problem. The current episode started 6 to 12 hours ago. The problem occurs constantly. The problem has not changed since onset.The pain is associated with rest. The pain is present in the substernal region. The pain is moderate. The quality of the pain is described as brief. The pain does not radiate. Exacerbated by: post emesis with flu like symptoms and cough. Associated symptoms include cough, nausea and vomiting. Pertinent negatives include no abdominal pain, no diaphoresis, no leg pain, no lower extremity edema, no orthopnea, no palpitations, no PND and no shortness of breath. He has tried nothing for the symptoms. The treatment provided no relief. Risk factors include male gender.  Pertinent negatives for past medical history include no Marfan's syndrome and no MI.  Pertinent negatives for family medical history include: no Marfan's syndrome.  Procedure history is negative for cardiac catheterization.    Past Medical History:  Diagnosis Date  . Childhood asthma   . Diabetes mellitus type I (HCC)   . [id number](425.9)     Patient Active Problem List   Diagnosis Date Noted  . 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 BURSITIS, LEFT KNEE 03/01/2010  . DIABETES MELLITUS, TYPE I 07/09/2009  . TOBACCO ABUSE 07/09/2009  . ASTHMA 07/09/2009  . HEADACHE 07/09/2009    Past Surgical History:  Procedure Laterality Date  . HERNIA REPAIR         Home Medications    Prior to Admission medications   Medication Sig Start Date End Date Taking? Authorizing Provider  buPROPion (WELLBUTRIN XL) 150 MG 24 hr tablet Take 150 mg by mouth daily.      [provider]    glucose blood (ONE TOUCH TEST STRIPS) test strip Use to check blood sugar four times daily     [provider]  insulin glargine (LANTUS SOLOSTAR) 100 UNIT/ML injection Inject 20-40 Units into the skin at bedtime.      [provider]  insulin glulisine (APIDRA) 100 UNIT/ML injection Inject 10-30 Units into the skin. Three times a day as directed     [provider]  Insulin Pen Needle (PEN NEEDLES 31GX5/16") 31G X 8 MM MISC Use four times a day for insulin injection     [provider]  ondansetron (ZOFRAN ODT) 8 MG disintegrating tablet Take 1 tablet (8 mg total) by mouth every 8 (eight) hours as needed for nausea or vomiting. 05/16/17   Molpus, Jonny Ruiz, MD    Family History Family History  Problem Relation Age of Onset  . Arthritis Unknown   . Breast cancer Paternal Grandmother   . Hypertension Mother   . Diabetes Mother        borderline  . Hypertension Brother   . Stroke Maternal Grandmother   . Diabetes type II Sister   . Other Unknown        no family history of colon cancer or prostate cancer    Social History Social History   Tobacco Use  . Smoking status: Current Every Day Smoker    Packs/day: 1.50    Types: Cigarettes  . Smokeless tobacco: Never Used  . Tobacco comment: smoking  since the age of 19  Substance Use Topics  . Alcohol use: Yes    Comment: social  . Drug use: No     Allergies   Patient has no known allergies.   Review of Systems Review of Systems  Constitutional: Negative for diaphoresis.  Respiratory: Positive for cough. Negative for shortness of breath.   Cardiovascular: Positive for chest pain. Negative for palpitations, orthopnea and PND.  Gastrointestinal: Positive for diarrhea, nausea and vomiting. Negative for abdominal pain.  Musculoskeletal: Negative for arthralgias and neck pain.  All other systems reviewed and are negative.    Physical Exam Updated Vital Signs BP 110/75   Pulse 79   Temp 98.3  F (36.8 C)   Resp 18   Ht 6' (1.829 m)   Wt 115.7 kg (255 lb)   SpO2 99%   BMI 34.58 kg/m   Physical Exam  Constitutional: He is oriented to person, place, and time. He appears well-developed and well-nourished.  HENT:  Head: Normocephalic and atraumatic.  Eyes: Pupils are equal, round, and reactive to light. Conjunctivae are normal.  Neck: Normal range of motion. Neck supple.  Cardiovascular: Normal rate, regular rhythm, normal heart sounds and intact distal pulses.  Pulmonary/Chest: Effort normal and breath sounds normal. No stridor. He has no wheezes. He has no rales.  Abdominal: Soft. Bowel sounds are normal. He exhibits no mass. There is no tenderness. There is no rebound and no guarding.  Musculoskeletal: Normal range of motion. He exhibits no edema or tenderness.  Neurological: He is alert and oriented to person, place, and time. He displays normal reflexes.  Skin: Skin is warm and dry. Capillary refill takes less than 2 seconds. He is not diaphoretic.  Psychiatric: He has a normal mood and affect.     ED Treatments / Results  Labs (all labs ordered are listed, but only abnormal results are displayed)  Results for orders placed or performed during the hospital encounter of 05/17/17  CBC with Differential/Platelet  Result Value Ref Range   WBC 7.4 4.0 - 10.5 K/uL   RBC 4.11 (L) 4.22 - 5.81 MIL/uL   Hemoglobin 12.5 (L) 13.0 - 17.0 g/dL   HCT 16.1 (L) 09.6 - 04.5 %   MCV 84.9 78.0 - 100.0 fL   MCH 30.4 26.0 - 34.0 pg   MCHC 35.8 30.0 - 36.0 g/dL   RDW 40.9 81.1 - 91.4 %   Platelets 215 150 - 400 K/uL   Neutrophils Relative % 56 %   Neutro Abs 4.1 1.7 - 7.7 K/uL   Lymphocytes Relative 28 %   Lymphs Abs 2.1 0.7 - 4.0 K/uL   Monocytes Relative 11 %   Monocytes Absolute 0.8 0.1 - 1.0 K/uL   Eosinophils Relative 5 %   Eosinophils Absolute 0.4 0.0 - 0.7 K/uL   Basophils Relative 0 %   Basophils Absolute 0.0 0.0 - 0.1 K/uL  Basic metabolic panel  Result Value Ref  Range   Sodium 137 135 - 145 mmol/L   Potassium 3.4 (L) 3.5 - 5.1 mmol/L   Chloride 102 101 - 111 mmol/L   CO2 26 22 - 32 mmol/L   Glucose, Bld 158 (H) 65 - 99 mg/dL   BUN 16 6 - 20 mg/dL   Creatinine, Ser 7.82 0.61 - 1.24 mg/dL   Calcium 8.3 (L) 8.9 - 10.3 mg/dL   GFR calc non Af Amer >60 >60 mL/min   GFR calc Af Amer >60 >60 mL/min   Anion gap 9  5 - 15  Troponin I  Result Value Ref Range   Troponin I <0.03 <0.03 ng/mL  Rapid urine drug screen (hospital performed)  Result Value Ref Range   Opiates NONE DETECTED NONE DETECTED   Cocaine NONE DETECTED NONE DETECTED   Benzodiazepines NONE DETECTED NONE DETECTED   Amphetamines NONE DETECTED NONE DETECTED   Tetrahydrocannabinol NONE DETECTED NONE DETECTED   Barbiturates NONE DETECTED NONE DETECTED  Troponin I  Result Value Ref Range   Troponin I <0.03 <0.03 ng/mL   Dg Chest 2 View  Result Date: 05/18/2017 CLINICAL DATA:  Cough and chest pain. EXAM: CHEST - 2 VIEW COMPARISON:  04/25/2016 FINDINGS: Lung volumes are low. There patchy perihilar opacities. Mild central bronchial thickening. No pleural fluid or pneumothorax. No acute osseous abnormalities. IMPRESSION: Bilateral patchy perihilar opacities with central bronchial thickening. Differential considerations asthma with atelectasis versus infection with bronchitis/bronchiolitis. Electronically Signed   By: Rubye OaksMelanie  Ehinger M.D.   On: 05/18/2017 00:33    EKG  EKG Interpretation  Date/Time:  Thursday May 17 2017 22:01:25 EDT Ventricular Rate:  93 PR Interval:  186 QRS Duration: 74 QT Interval:  354 QTC Calculation: 440 R Axis:   57 Text Interpretation:  Normal sinus rhythm Confirmed by Nicanor AlconPalumbo, Jaeli Grubb ([id number]) on 05/17/2017 11:32:40 PM       Radiology Dg Chest 2 View  Result Date: 05/18/2017 CLINICAL DATA:  Cough and chest pain. EXAM: CHEST - 2 VIEW COMPARISON:  04/25/2016 FINDINGS: Lung volumes are low. There patchy perihilar opacities. Mild central bronchial thickening.  No pleural fluid or pneumothorax. No acute osseous abnormalities. IMPRESSION: Bilateral patchy perihilar opacities with central bronchial thickening. Differential considerations asthma with atelectasis versus infection with bronchitis/bronchiolitis. Electronically Signed   By: Rubye OaksMelanie  Ehinger M.D.   On: 05/18/2017 00:33    Procedures Procedures (including critical care time)  Medications Ordered in ED Medications  sodium chloride 0.9 % bolus 1,000 mL (0 mLs Intravenous Stopped 05/18/17 0228)  ketorolac (TORADOL) 30 MG/ML injection 15 mg (15 mg Intravenous Given 05/17/17 2352)  gi cocktail (Maalox,Lidocaine,Donnatal) (30 mLs Oral Given 05/18/17 0148)      Final Clinical Impressions(s) / ED Diagnoses   Likely spasm from vomiting: no DKA.  Perc negative and wells 0 highly doubt PE in this low risk patient.  HEART score is 1 very low risk for MACE.  Return for weakness, numbness, changes in vision or speech, fevers >100.4 unrelieved by medication, shortness of breath, intractable vomiting, or diarrhea, abdominal pain, Inability to tolerate liquids or food, cough, altered mental status or any concerns. No signs of systemic illness or infection. The patient is nontoxic-appearing on exam and vital signs are within normal limits.   I have reviewed the triage vital signs and the nursing notes. Pertinent labs &imaging results that were available during my care of the patient were reviewed by me and considered in my medical decision making (see chart for details).  After history, exam, and medical workup I feel the patient has been appropriately medically screened and is safe for discharge home. Pertinent diagnoses were discussed with the patient. Patient was given return precautions.   Eunique Balik, MD 05/18/17 0300

## 2018-04-25 ENCOUNTER — Other Ambulatory Visit: Payer: Self-pay

## 2018-04-25 ENCOUNTER — Emergency Department (HOSPITAL_BASED_OUTPATIENT_CLINIC_OR_DEPARTMENT_OTHER)
Admission: EM | Admit: 2018-04-25 | Discharge: 2018-04-25 | Disposition: A | Discharge status: Home / Self Care | Payer: BLUE CROSS/BLUE SHIELD | Attending: Emergency Medicine | Admitting: Emergency Medicine

## 2018-04-25 ENCOUNTER — Encounter (HOSPITAL_BASED_OUTPATIENT_CLINIC_OR_DEPARTMENT_OTHER): Payer: Self-pay | Admitting: Emergency Medicine

## 2018-04-25 DIAGNOSIS — Z794 Long term (current) use of insulin: Secondary | ICD-10-CM | POA: Insufficient documentation

## 2018-04-25 DIAGNOSIS — J45909 Unspecified asthma, uncomplicated: Secondary | ICD-10-CM | POA: Diagnosis not present

## 2018-04-25 DIAGNOSIS — F17210 Nicotine dependence, cigarettes, uncomplicated: Secondary | ICD-10-CM | POA: Insufficient documentation

## 2018-04-25 DIAGNOSIS — R05 Cough: Secondary | ICD-10-CM | POA: Diagnosis present

## 2018-04-25 DIAGNOSIS — J111 Influenza due to unidentified influenza virus with other respiratory manifestations: Secondary | ICD-10-CM | POA: Diagnosis not present

## 2018-04-25 DIAGNOSIS — E109 Type 1 diabetes mellitus without complications: Secondary | ICD-10-CM | POA: Insufficient documentation

## 2018-04-25 DIAGNOSIS — Z79899 Other long term (current) drug therapy: Secondary | ICD-10-CM | POA: Diagnosis not present

## 2018-04-25 DIAGNOSIS — R6889 Other general symptoms and signs: Secondary | ICD-10-CM

## 2018-04-25 MED ORDER — ACETAMINOPHEN 325 MG PO TABS
650.0000 mg | ORAL_TABLET | Freq: Once | ORAL | Status: AC
  Administered 2018-04-25: 650 mg via ORAL
  Filled 2018-04-25: qty 2

## 2018-04-25 MED ORDER — OSELTAMIVIR PHOSPHATE 75 MG PO CAPS: 75.0000 mg | ORAL_CAPSULE | Freq: Two times a day (BID) | ORAL | 0 refills | Status: DC

## 2018-04-25 NOTE — ED Provider Notes (Signed)
[facility] EMERGENCY DEPARTMENT Provider Note   CSN: [id number] Arrival date & time: 04/25/18  0030    History   Chief Complaint Chief Complaint  Patient presents with  . Influenza    HPI Sean Richards is a 33 y.o. male.     The history is provided by the patient.  Influenza  Presenting symptoms: cough, fatigue, fever, headache, myalgias, sore throat and vomiting   Presenting symptoms: no diarrhea and no shortness of breath   Presenting symptoms comment:  Post tussive emesis  Severity:  Moderate Onset quality:  Gradual Duration:  8 hours Progression:  Worsening Chronicity:  New Relieved by:  Nothing Worsened by:  Nothing Associated symptoms: chills   Risk factors: diabetes    Patient presents with flulike symptoms.  This has been ongoing for up to 8 hours.  He reports feeling feverish. No travel, but does report using E cigarettes and vaping Past Medical History:  Diagnosis Date  . Childhood asthma   . Diabetes mellitus type I (HCC)   . ZSWFUXNA(355.7)     Patient Active Problem List   Diagnosis Date Noted  . 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 BURSITIS, LEFT KNEE 03/01/2010  . DIABETES MELLITUS, TYPE I 07/09/2009  . TOBACCO ABUSE 07/09/2009  . ASTHMA 07/09/2009  . HEADACHE 07/09/2009    Past Surgical History:  Procedure Laterality Date  . HERNIA REPAIR          Home Medications    Prior to Admission medications   Medication Sig Start Date End Date Taking? Authorizing Provider  atorvastatin (LIPITOR) 80 MG tablet TAKE 1 TABLET(80 MG) BY MOUTH DAILY 05/23/17  Yes [provider]  escitalopram (LEXAPRO) 10 MG tablet TAKE 1 TABLET(10 MG) BY MOUTH DAILY 05/23/17  Yes [provider]  ezetimibe (ZETIA) 10 MG tablet Take by mouth. 03/19/18  Yes [provider]  insulin degludec (TRESIBA FLEXTOUCH) 100 UNIT/ML SOPN FlexTouch Pen INJECT 58 UNITS UNDER THE SKIN ONCE DAILY WITH ADDITIONAL AS NEEDED( MAXIMUM DAILY  DOSE IS 100 UNITS) 03/26/18  Yes [provider]  buPROPion (WELLBUTRIN XL) 150 MG 24 hr tablet Take 150 mg by mouth daily.      [provider]  glucose blood (ONE TOUCH TEST STRIPS) test strip Use to check blood sugar four times daily     [provider]  insulin glargine (LANTUS SOLOSTAR) 100 UNIT/ML injection Inject 20-40 Units into the skin at bedtime.      [provider]  insulin glulisine (APIDRA) 100 UNIT/ML injection Inject 10-30 Units into the skin. Three times a day as directed     [provider]  Insulin Pen Needle (PEN NEEDLES 31GX5/16") 31G X 8 MM MISC Use four times a day for insulin injection     [provider]  ondansetron (ZOFRAN ODT) 8 MG disintegrating tablet Take 1 tablet (8 mg total) by mouth every 8 (eight) hours as needed for nausea or vomiting. 05/16/17   Molpus, John, MD  oseltamivir (TAMIFLU) 75 MG capsule Take 1 capsule (75 mg total) by mouth every 12 (twelve) hours. 04/25/18   Zadie Rhine, MD  pantoprazole (PROTONIX) 20 MG tablet Take 1 tablet (20 mg total) by mouth daily. 05/18/17   Palumbo, April, MD    Family History Family History  Problem Relation Age of Onset  . Arthritis Other   . Breast cancer Paternal Grandmother   . Hypertension Mother   . Diabetes Mother        borderline  .  Hypertension Brother   . Stroke Maternal Grandmother   . Diabetes type II Sister   . Other Other        no family history of colon cancer or prostate cancer    Social History Social History   Tobacco Use  . Smoking status: Current Every Day Smoker    Packs/day: 1.50    Types: Cigarettes, E-cigarettes  . Smokeless tobacco: Never Used  . Tobacco comment: smoking since the age of 5  Substance Use Topics  . Alcohol use: Yes    Comment: social  . Drug use: No     Allergies   Patient has no known allergies.   Review of Systems Review of Systems  Constitutional: Positive for chills, fatigue and fever.    HENT: Positive for sore throat.   Respiratory: Positive for cough. Negative for shortness of breath.   Gastrointestinal: Positive for vomiting. Negative for diarrhea.  Musculoskeletal: Positive for myalgias.  Neurological: Positive for headaches.  All other systems reviewed and are negative.    Physical Exam Updated Vital Signs BP (!) 131/91   Pulse (!) 124   Temp 99 F (37.2 C) (Oral)   Resp 16   Ht 1.829 m (6')   Wt 111.1 kg   SpO2 98%   BMI 33.23 kg/m   Physical Exam CONSTITUTIONAL: Well developed/well nourished, does not smell of ketones HEAD: Normocephalic/atraumatic EYES: EOMI/PERRL ENMT: Mucous membranes moist, uvula midline with erythema but no exudate NECK: supple no meningeal signs SPINE/BACK:entire spine nontender CV: S1/S2 noted, no murmurs/rubs/gallops noted, tachycardic LUNGS: Lungs are clear to auscultation bilaterally, no apparent distress ABDOMEN: soft, nontender, no rebound or guarding, bowel sounds noted throughout abdomen GU:no cva tenderness NEURO: Pt is awake/alert/appropriate, moves all extremitiesx4.  No facial droop.   EXTREMITIES: pulses normal/equal, full ROM SKIN: warm, color normal PSYCH: no abnormalities of mood noted, alert and oriented to situation   ED Treatments / Results  Labs (all labs ordered are listed, but only abnormal results are displayed) Labs Reviewed - No data to display  EKG None  Radiology No results found.  Procedures Procedures   Medications Ordered in ED Medications  acetaminophen (TYLENOL) tablet 650 mg (650 mg Oral Given 04/25/18 0133)     Initial Impression / Assessment and Plan / ED Course  I have reviewed the triage vital signs and the nursing notes.      Strong suspicion for flu like illness Pt well appearing Lung sounds clear No hypoxia After discussion, we will defer further workup as he feels improved He would like to start tamiflu Discussed strict ER return precautions Advised to  closely monitor glucose No signs of DKA at this time  Final Clinical Impressions(s) / ED Diagnoses   Final diagnoses:  Flu-like symptoms    ED Discharge Orders         Ordered    oseltamivir (TAMIFLU) 75 MG capsule  Every 12 hours     04/25/18 0129           Zadie Rhine, MD 04/25/18 [phone]

## 2018-04-25 NOTE — ED Triage Notes (Signed)
Pt reports flu like s/s since today. Hx diabetes.

## 2018-04-26 ENCOUNTER — Emergency Department (HOSPITAL_BASED_OUTPATIENT_CLINIC_OR_DEPARTMENT_OTHER)
Admission: EM | Admit: 2018-04-26 | Discharge: 2018-04-26 | Disposition: A | Discharge status: Home / Self Care | Payer: BLUE CROSS/BLUE SHIELD | Attending: Emergency Medicine | Admitting: Emergency Medicine

## 2018-04-26 ENCOUNTER — Other Ambulatory Visit: Payer: Self-pay

## 2018-04-26 ENCOUNTER — Encounter (HOSPITAL_BASED_OUTPATIENT_CLINIC_OR_DEPARTMENT_OTHER): Payer: Self-pay | Admitting: Emergency Medicine

## 2018-04-26 DIAGNOSIS — R519 Headache, unspecified: Secondary | ICD-10-CM

## 2018-04-26 DIAGNOSIS — J45909 Unspecified asthma, uncomplicated: Secondary | ICD-10-CM | POA: Insufficient documentation

## 2018-04-26 DIAGNOSIS — J111 Influenza due to unidentified influenza virus with other respiratory manifestations: Secondary | ICD-10-CM | POA: Insufficient documentation

## 2018-04-26 DIAGNOSIS — Z79899 Other long term (current) drug therapy: Secondary | ICD-10-CM | POA: Insufficient documentation

## 2018-04-26 DIAGNOSIS — R05 Cough: Secondary | ICD-10-CM | POA: Diagnosis present

## 2018-04-26 DIAGNOSIS — R69 Illness, unspecified: Secondary | ICD-10-CM

## 2018-04-26 DIAGNOSIS — E1065 Type 1 diabetes mellitus with hyperglycemia: Secondary | ICD-10-CM | POA: Insufficient documentation

## 2018-04-26 DIAGNOSIS — R739 Hyperglycemia, unspecified: Secondary | ICD-10-CM

## 2018-04-26 DIAGNOSIS — R51 Headache: Secondary | ICD-10-CM

## 2018-04-26 DIAGNOSIS — F17210 Nicotine dependence, cigarettes, uncomplicated: Secondary | ICD-10-CM | POA: Diagnosis not present

## 2018-04-26 LAB — URINALYSIS, ROUTINE W REFLEX MICROSCOPIC
Bilirubin Urine: NEGATIVE
Glucose, UA: >=500 mg/dL — AB
HGB URINE DIPSTICK: NEGATIVE
Ketones, ur: 15 mg/dL — AB
Leukocytes,Ua: NEGATIVE
Nitrite: NEGATIVE
Protein, ur: NEGATIVE mg/dL
Specific Gravity, Urine: 1.02 (ref 1.005–1.030)
pH: 6 (ref 5.0–8.0)

## 2018-04-26 LAB — CBC WITH DIFFERENTIAL/PLATELET
Abs Immature Granulocytes: 0.01 10*3/uL (ref 0.00–0.07)
Basophils Absolute: 0 10*3/uL (ref 0.0–0.1)
Basophils Relative: 1 %
Eosinophils Absolute: 0.2 10*3/uL (ref 0.0–0.5)
Eosinophils Relative: 3 %
HCT: 37.8 % — ABNORMAL LOW (ref 39.0–52.0)
Hemoglobin: 12.9 g/dL — ABNORMAL LOW (ref 13.0–17.0)
Immature Granulocytes: 0 %
Lymphocytes Relative: 14 %
Lymphs Abs: 0.8 10*3/uL (ref 0.7–4.0)
MCH: 29.1 pg (ref 26.0–34.0)
MCHC: 34.1 g/dL (ref 30.0–36.0)
MCV: 85.1 fL (ref 80.0–100.0)
Monocytes Absolute: 0.8 10*3/uL (ref 0.1–1.0)
Monocytes Relative: 13 %
Neutro Abs: 4.1 10*3/uL (ref 1.7–7.7)
Neutrophils Relative %: 69 %
Platelets: 233 10*3/uL (ref 150–400)
RBC: 4.44 MIL/uL (ref 4.22–5.81)
RDW: 13.3 % (ref 11.5–15.5)
WBC: 5.9 10*3/uL (ref 4.0–10.5)
nRBC: 0 % (ref 0.0–0.2)

## 2018-04-26 LAB — COMPREHENSIVE METABOLIC PANEL WITH GFR
ALT: 40 U/L (ref 0–44)
AST: 46 U/L — ABNORMAL HIGH (ref 15–41)
Albumin: 3.8 g/dL (ref 3.5–5.0)
Alkaline Phosphatase: 99 U/L (ref 38–126)
Anion gap: 8 (ref 5–15)
BUN: 18 mg/dL (ref 6–20)
CO2: 23 mmol/L (ref 22–32)
Calcium: 8.2 mg/dL — ABNORMAL LOW (ref 8.9–10.3)
Chloride: 101 mmol/L (ref 98–111)
Creatinine, Ser: 0.92 mg/dL (ref 0.61–1.24)
GFR calc Af Amer: >60 mL/min (ref >60)
GFR calc non Af Amer: >60 mL/min (ref >60)
Glucose, Bld: 305 mg/dL — ABNORMAL HIGH (ref 70–99)
Potassium: 3.1 mmol/L — ABNORMAL LOW (ref 3.5–5.1)
Sodium: 132 mmol/L — ABNORMAL LOW (ref 135–145)
Total Bilirubin: 1.1 mg/dL (ref 0.3–1.2)
Total Protein: 7.3 g/dL (ref 6.5–8.1)

## 2018-04-26 LAB — POCT I-STAT EG7
Bicarbonate: 24.5 mmol/L (ref 20.0–28.0)
Calcium, Ion: 1.11 mmol/L — ABNORMAL LOW (ref 1.15–1.40)
HCT: 33 % — ABNORMAL LOW (ref 39.0–52.0)
Hemoglobin: 11.2 g/dL — ABNORMAL LOW (ref 13.0–17.0)
O2 Saturation: 57 %
POTASSIUM: 3.4 mmol/L — AB (ref 3.5–5.1)
Patient temperature: 100.3
Sodium: 137 mmol/L (ref 135–145)
TCO2: 26 mmol/L (ref 22–32)
pCO2, Ven: 39.9 mmHg — ABNORMAL LOW (ref 44.0–60.0)
pH, Ven: 7.4 (ref 7.250–7.430)
pO2, Ven: 31 mmHg — CL (ref 32.0–45.0)

## 2018-04-26 LAB — URINALYSIS, MICROSCOPIC (REFLEX): Squamous Epithelial / HPF: NONE SEEN (ref 0–5)

## 2018-04-26 LAB — CBG MONITORING, ED
Glucose-Capillary: 276 mg/dL — ABNORMAL HIGH (ref 70–99)
Glucose-Capillary: 205 mg/dL — ABNORMAL HIGH (ref 70–99)

## 2018-04-26 LAB — LACTIC ACID, PLASMA: Lactic Acid, Venous: 1 mmol/L (ref 0.5–1.9)

## 2018-04-26 MED ORDER — DIPHENHYDRAMINE HCL 50 MG/ML IJ SOLN
25.0000 mg | Freq: Once | INTRAMUSCULAR | Status: AC
  Administered 2018-04-26: 25 mg via INTRAVENOUS
  Filled 2018-04-26: qty 1

## 2018-04-26 MED ORDER — KETOROLAC TROMETHAMINE 30 MG/ML IJ SOLN
30.0000 mg | Freq: Once | INTRAMUSCULAR | Status: AC
  Administered 2018-04-26: 30 mg via INTRAVENOUS
  Filled 2018-04-26: qty 1

## 2018-04-26 MED ORDER — SODIUM CHLORIDE 0.9 % IV BOLUS
1000.0000 mL | Freq: Once | INTRAVENOUS | Status: AC
  Administered 2018-04-26: 1000 mL via INTRAVENOUS

## 2018-04-26 MED ORDER — ONDANSETRON HCL 4 MG/2ML IJ SOLN
4.0000 mg | Freq: Once | INTRAMUSCULAR | Status: AC
  Administered 2018-04-26: 4 mg via INTRAVENOUS
  Filled 2018-04-26: qty 2

## 2018-04-26 MED ORDER — ACETAMINOPHEN 500 MG PO TABS
1000.0000 mg | ORAL_TABLET | Freq: Once | ORAL | Status: AC
  Administered 2018-04-26: 1000 mg via ORAL
  Filled 2018-04-26: qty 2

## 2018-04-26 MED ORDER — PROCHLORPERAZINE EDISYLATE 10 MG/2ML IJ SOLN
10.0000 mg | Freq: Once | INTRAMUSCULAR | Status: AC
  Administered 2018-04-26: 10 mg via INTRAVENOUS
  Filled 2018-04-26: qty 2

## 2018-04-26 MED ORDER — ONDANSETRON 4 MG PO TBDP: 4.0000 mg | ORAL_TABLET | Freq: Three times a day (TID) | ORAL | 0 refills | Status: DC | PRN

## 2018-04-26 MED ORDER — POTASSIUM CHLORIDE CRYS ER 20 MEQ PO TBCR
40.0000 meq | EXTENDED_RELEASE_TABLET | Freq: Once | ORAL | Status: AC
  Administered 2018-04-26: 40 meq via ORAL
  Filled 2018-04-26: qty 2

## 2018-04-26 NOTE — ED Triage Notes (Signed)
Seen yesterday for flu now he cannot get his sugar under control spilling ketones

## 2018-04-26 NOTE — ED Provider Notes (Signed)
[facility] EMERGENCY DEPARTMENT Provider Note   CSN: [id number] Arrival date & time: 04/26/18  0840    History   Chief Complaint Chief Complaint  Patient presents with  . Hyperglycemia    HPI Sean Richards is a 33 y.o. male.     33yo M w/ PMH including IDDM, asthma who p/w hyperglycemia and flu. Pt evaluated in ED yesterday and diagnosed with flu-like illness. He was given tamiflu which he has been taking.  He has continued to have cough, fevers, body aches, and nausea.  He has been compliant with his insulin regimen but has had persistent hyperglycemia at home up to 400s.  He has been dipping his urine and has had mild ketones but more moderate ketones this morning.  He took Motrin earlier this morning.  No vomiting or diarrhea.  He has been trying to drink fluids.  The history is provided by the patient.    Past Medical History:  Diagnosis Date  . Childhood asthma   . Diabetes mellitus type I (HCC)   . [id number](308.6)     Patient Active Problem List   Diagnosis Date Noted  . 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 BURSITIS, LEFT KNEE 03/01/2010  . DIABETES MELLITUS, TYPE I 07/09/2009  . TOBACCO ABUSE 07/09/2009  . ASTHMA 07/09/2009  . HEADACHE 07/09/2009    Past Surgical History:  Procedure Laterality Date  . HERNIA REPAIR          Home Medications    Prior to Admission medications   Medication Sig Start Date End Date Taking? Authorizing Provider  atorvastatin (LIPITOR) 80 MG tablet TAKE 1 TABLET(80 MG) BY MOUTH DAILY 05/23/17   [provider]  buPROPion (WELLBUTRIN XL) 150 MG 24 hr tablet Take 150 mg by mouth daily.      [provider]  escitalopram (LEXAPRO) 10 MG tablet TAKE 1 TABLET(10 MG) BY MOUTH DAILY 05/23/17   [provider]  ezetimibe (ZETIA) 10 MG tablet Take by mouth. 03/19/18   [provider]  glucose blood (ONE TOUCH TEST STRIPS) test strip Use to check blood sugar four times daily      [provider]  insulin degludec (TRESIBA FLEXTOUCH) 100 UNIT/ML SOPN FlexTouch Pen INJECT 58 UNITS UNDER THE SKIN ONCE DAILY WITH ADDITIONAL AS NEEDED( MAXIMUM DAILY DOSE IS 100 UNITS) 03/26/18   [provider]  insulin glargine (LANTUS SOLOSTAR) 100 UNIT/ML injection Inject 20-40 Units into the skin at bedtime.      [provider]  insulin glulisine (APIDRA) 100 UNIT/ML injection Inject 10-30 Units into the skin. Three times a day as directed     [provider]  Insulin Pen Needle (PEN NEEDLES 31GX5/16") 31G X 8 MM MISC Use four times a day for insulin injection     [provider]  ondansetron (ZOFRAN ODT) 4 MG disintegrating tablet Take 1 tablet (4 mg total) by mouth every 8 (eight) hours as needed for nausea or vomiting. 04/26/18   Fernando Torry, Ambrose Finland, MD  oseltamivir (TAMIFLU) 75 MG capsule Take 1 capsule (75 mg total) by mouth every 12 (twelve) hours. 04/25/18   Zadie Rhine, MD  pantoprazole (PROTONIX) 20 MG tablet Take 1 tablet (20 mg total) by mouth daily. 05/18/17   Palumbo, April, MD    Family History Family History  Problem Relation Age of Onset  . Arthritis Other   . Breast cancer Paternal Grandmother   . Hypertension Mother   . Diabetes Mother  borderline  . Hypertension Brother   . Stroke Maternal Grandmother   . Diabetes type II Sister   . Other Other        no family history of colon cancer or prostate cancer    Social History Social History   Tobacco Use  . Smoking status: Current Every Day Smoker    Packs/day: 1.50    Types: Cigarettes, E-cigarettes  . Smokeless tobacco: Never Used  . Tobacco comment: smoking since the age of 89  Substance Use Topics  . Alcohol use: Yes    Comment: social  . Drug use: No     Allergies   Patient has no known allergies.   Review of Systems Review of Systems All other systems reviewed and are negative except that which was mentioned in HPI   Physical  Exam Updated Vital Signs BP 102/64 (BP Location: Right Arm)   Pulse 88   Temp 100.1 F (37.8 C) (Oral)   Resp 18   Ht 6' (1.829 m)   Wt 111.1 kg   SpO2 100%   BMI 33.23 kg/m   Physical Exam Vitals signs and nursing note reviewed.  Constitutional:      General: He is not in acute distress.    Appearance: He is well-developed.     Comments: Ill appearing but non-toxic  HENT:     Head: Normocephalic and atraumatic.     Nose: Nose normal.     Mouth/Throat:     Mouth: Mucous membranes are moist.     Pharynx: Oropharynx is clear.  Eyes:     General:        Right eye: No discharge.        Left eye: No discharge.  Neck:     Musculoskeletal: Neck supple.  Cardiovascular:     Rate and Rhythm: Regular rhythm. Tachycardia present.     Heart sounds: Normal heart sounds. No murmur.  Pulmonary:     Effort: Pulmonary effort is normal.     Breath sounds: Normal breath sounds.  Abdominal:     General: Bowel sounds are normal. There is no distension.     Palpations: Abdomen is soft.     Tenderness: There is no abdominal tenderness.  Musculoskeletal:        General: No swelling.  Skin:    General: Skin is warm and dry.  Neurological:     Mental Status: He is alert and oriented to person, place, and time.     Comments: Fluent speech  Psychiatric:        Judgment: Judgment normal.      ED Treatments / Results  Labs (all labs ordered are listed, but only abnormal results are displayed) Labs Reviewed  COMPREHENSIVE METABOLIC PANEL - Abnormal; Notable for the following components:      Result Value   Sodium 132 (*)    Potassium 3.1 (*)    Glucose, Bld 305 (*)    Calcium 8.2 (*)    AST 46 (*)    All other components within normal limits  CBC WITH DIFFERENTIAL/PLATELET - Abnormal; Notable for the following components:   Hemoglobin 12.9 (*)    HCT 37.8 (*)    All other components within normal limits  URINALYSIS, ROUTINE W REFLEX MICROSCOPIC - Abnormal; Notable for the  following components:   Glucose, UA >=500 (*)    Ketones, ur 15 (*)    All other components within normal limits  URINALYSIS, MICROSCOPIC (REFLEX) - Abnormal; Notable for the following  components:   Bacteria, UA RARE (*)    All other components within normal limits  CBG MONITORING, ED - Abnormal; Notable for the following components:   Glucose-Capillary 276 (*)    All other components within normal limits  POCT I-STAT EG7 - Abnormal; Notable for the following components:   pCO2, Ven 39.9 (*)    pO2, Ven 31.0 (*)    Potassium 3.4 (*)    Calcium, Ion 1.11 (*)    HCT 33.0 (*)    Hemoglobin 11.2 (*)    All other components within normal limits  CBG MONITORING, ED - Abnormal; Notable for the following components:   Glucose-Capillary 205 (*)    All other components within normal limits  LACTIC ACID, PLASMA  LACTIC ACID, PLASMA  I-STAT VENOUS BLOOD GAS, ED    EKG None  Radiology No results found.  Procedures Procedures (including critical care time)  Medications Ordered in ED Medications  ondansetron (ZOFRAN) injection 4 mg (4 mg Intravenous Given 04/26/18 0950)  sodium chloride 0.9 % bolus 1,000 mL (0 mLs Intravenous Stopped 04/26/18 1033)  acetaminophen (TYLENOL) tablet 1,000 mg (1,000 mg Oral Given 04/26/18 0949)  sodium chloride 0.9 % bolus 1,000 mL (0 mLs Intravenous Stopped 04/26/18 1132)  potassium chloride SA (K-DUR,KLOR-CON) CR tablet 40 mEq (40 mEq Oral Given 04/26/18 0957)  ketorolac (TORADOL) 30 MG/ML injection 30 mg (30 mg Intravenous Given 04/26/18 1208)  diphenhydrAMINE (BENADRYL) injection 25 mg (25 mg Intravenous Given 04/26/18 1206)  prochlorperazine (COMPAZINE) injection 10 mg (10 mg Intravenous Given 04/26/18 1207)     Initial Impression / Assessment and Plan / ED Course  I have reviewed the triage vital signs and the nursing notes.  Pertinent labs  that were available during my care of the patient were reviewed by me and considered in my medical decision  making (see chart for details).        Ill but non-toxic on exam, T 100.9, HR 125, normal BP. No abd tenderness.  Labs show hyperglycemia without DKA.  Normal creatinine.  Potassium 3.1, gave oral repletion.  CBC reassuring.  Gave 2 L of IV fluids.  Later gave migraine cocktail for headache.  On reassessment, patient's fever has mildly improved, tachycardia resolved.  He states his headache has improved.  He feels comfortable going home and continuing supportive measures for his symptoms.  Given the association of cough and sore throat with fevers and body aches, I do not feel his current symptoms represent meningitis.  I have recommended continued close monitoring of his blood glucose, continued aggressive hydration at home, control of fevers with Tylenol/Motrin, and close PCP follow-up.  I have extensively reviewed return precautions with the patient and his wife and they voiced understanding. Final Clinical Impressions(s) / ED Diagnoses   Final diagnoses:  Hyperglycemia  Influenza-like illness  Acute nonintractable headache, unspecified headache type    ED Discharge Orders         Ordered    ondansetron (ZOFRAN ODT) 4 MG disintegrating tablet  Every 8 hours PRN     04/26/18 1259           Cerrone Debold, Ambrose Finland, MD 04/26/18 1514

## 2018-04-26 NOTE — ED Notes (Signed)
Pt given ginger ale diet states head still hurts wife states he has teeth that need to be seen to

## 2018-12-15 ENCOUNTER — Encounter (HOSPITAL_BASED_OUTPATIENT_CLINIC_OR_DEPARTMENT_OTHER): Payer: Self-pay | Admitting: *Deleted

## 2018-12-15 ENCOUNTER — Emergency Department (HOSPITAL_BASED_OUTPATIENT_CLINIC_OR_DEPARTMENT_OTHER): Payer: BC Managed Care – PPO

## 2018-12-15 ENCOUNTER — Emergency Department (HOSPITAL_BASED_OUTPATIENT_CLINIC_OR_DEPARTMENT_OTHER)
Admission: EM | Admit: 2018-12-15 | Discharge: 2018-12-15 | Disposition: A | Discharge status: Home / Self Care | Payer: BC Managed Care – PPO | Attending: Emergency Medicine | Admitting: Emergency Medicine

## 2018-12-15 ENCOUNTER — Other Ambulatory Visit: Payer: Self-pay

## 2018-12-15 DIAGNOSIS — F17210 Nicotine dependence, cigarettes, uncomplicated: Secondary | ICD-10-CM | POA: Insufficient documentation

## 2018-12-15 DIAGNOSIS — Z79899 Other long term (current) drug therapy: Secondary | ICD-10-CM | POA: Insufficient documentation

## 2018-12-15 DIAGNOSIS — R5383 Other fatigue: Secondary | ICD-10-CM | POA: Insufficient documentation

## 2018-12-15 DIAGNOSIS — Z20828 Contact with and (suspected) exposure to other viral communicable diseases: Secondary | ICD-10-CM

## 2018-12-15 DIAGNOSIS — E1065 Type 1 diabetes mellitus with hyperglycemia: Secondary | ICD-10-CM | POA: Insufficient documentation

## 2018-12-15 DIAGNOSIS — R05 Cough: Secondary | ICD-10-CM | POA: Diagnosis not present

## 2018-12-15 DIAGNOSIS — R739 Hyperglycemia, unspecified: Secondary | ICD-10-CM

## 2018-12-15 DIAGNOSIS — Z20822 Contact with and (suspected) exposure to covid-19: Secondary | ICD-10-CM

## 2018-12-15 LAB — CBC WITH DIFFERENTIAL/PLATELET
Abs Immature Granulocytes: 0.01 10*3/uL (ref 0.00–0.07)
Basophils Absolute: 0.1 10*3/uL (ref 0.0–0.1)
Basophils Relative: 1 %
Eosinophils Absolute: 0.2 10*3/uL (ref 0.0–0.5)
Eosinophils Relative: 3 %
HCT: 42.8 % (ref 39.0–52.0)
Hemoglobin: 14.3 g/dL (ref 13.0–17.0)
Immature Granulocytes: 0 %
Lymphocytes Relative: 23 %
Lymphs Abs: 1.4 10*3/uL (ref 0.7–4.0)
MCH: 28.9 pg (ref 26.0–34.0)
MCHC: 33.4 g/dL (ref 30.0–36.0)
MCV: 86.5 fL (ref 80.0–100.0)
Monocytes Absolute: 0.6 10*3/uL (ref 0.1–1.0)
Monocytes Relative: 9 %
Neutro Abs: 4 10*3/uL (ref 1.7–7.7)
Neutrophils Relative %: 64 %
Platelets: 300 10*3/uL (ref 150–400)
RBC: 4.95 MIL/uL (ref 4.22–5.81)
RDW: 12.8 % (ref 11.5–15.5)
WBC: 6.2 10*3/uL (ref 4.0–10.5)
nRBC: 0 % (ref 0.0–0.2)

## 2018-12-15 LAB — POCT I-STAT EG7
Acid-Base Excess: 1 mmol/L (ref 0.0–2.0)
Bicarbonate: 28.1 mmol/L — ABNORMAL HIGH (ref 20.0–28.0)
Calcium, Ion: 1.26 mmol/L (ref 1.15–1.40)
HCT: 44 % (ref 39.0–52.0)
Hemoglobin: 15 g/dL (ref 13.0–17.0)
O2 Saturation: 58 %
Patient temperature: 98
Potassium: 5.1 mmol/L (ref 3.5–5.1)
Sodium: 138 mmol/L (ref 135–145)
TCO2: 30 mmol/L (ref 22–32)
pCO2, Ven: 52.2 mmHg (ref 44.0–60.0)
pH, Ven: 7.337 (ref 7.250–7.430)
pO2, Ven: 32 mmHg (ref 32.0–45.0)

## 2018-12-15 LAB — URINALYSIS, MICROSCOPIC (REFLEX)
Bacteria, UA: NONE SEEN
RBC / HPF: NONE SEEN RBC/hpf (ref 0–5)
WBC, UA: NONE SEEN WBC/hpf (ref 0–5)

## 2018-12-15 LAB — COMPREHENSIVE METABOLIC PANEL
ALT: 30 U/L (ref 0–44)
AST: 25 U/L (ref 15–41)
Albumin: 4 g/dL (ref 3.5–5.0)
Alkaline Phosphatase: 99 U/L (ref 38–126)
Anion gap: 9 (ref 5–15)
BUN: 16 mg/dL (ref 6–20)
CO2: 25 mmol/L (ref 22–32)
Calcium: 9.1 mg/dL (ref 8.9–10.3)
Chloride: 99 mmol/L (ref 98–111)
Creatinine, Ser: 0.87 mg/dL (ref 0.61–1.24)
GFR calc Af Amer: >60 mL/min (ref >60)
GFR calc non Af Amer: >60 mL/min (ref >60)
Glucose, Bld: 559 mg/dL (ref 70–99)
Potassium: 5.1 mmol/L (ref 3.5–5.1)
Sodium: 133 mmol/L — ABNORMAL LOW (ref 135–145)
Total Bilirubin: 0.9 mg/dL (ref 0.3–1.2)
Total Protein: 7.6 g/dL (ref 6.5–8.1)

## 2018-12-15 LAB — URINALYSIS, ROUTINE W REFLEX MICROSCOPIC
Bilirubin Urine: NEGATIVE
Glucose, UA: >=500 mg/dL — AB
Hgb urine dipstick: NEGATIVE
Ketones, ur: NEGATIVE mg/dL
Leukocytes,Ua: NEGATIVE
Nitrite: NEGATIVE
Protein, ur: NEGATIVE mg/dL
Specific Gravity, Urine: 1.01 (ref 1.005–1.030)
pH: 6 (ref 5.0–8.0)

## 2018-12-15 LAB — CBG MONITORING, ED
Glucose-Capillary: 541 mg/dL (ref 70–99)
Glucose-Capillary: 344 mg/dL — ABNORMAL HIGH (ref 70–99)

## 2018-12-15 LAB — SARS CORONAVIRUS 2 (TAT 6-24 HRS): SARS Coronavirus 2: NEGATIVE

## 2018-12-15 MED ORDER — INSULIN REGULAR HUMAN 100 UNIT/ML IJ SOLN
10.0000 [IU] | Freq: Once | INTRAMUSCULAR | Status: AC
  Administered 2018-12-15: 10 [IU] via SUBCUTANEOUS
  Filled 2018-12-15: qty 1

## 2018-12-15 MED ORDER — SODIUM CHLORIDE 0.9 % IV BOLUS
1000.0000 mL | Freq: Once | INTRAVENOUS | Status: AC
  Administered 2018-12-15: 1000 mL via INTRAVENOUS

## 2018-12-15 MED ORDER — ACETAMINOPHEN 325 MG PO TABS
650.0000 mg | ORAL_TABLET | Freq: Once | ORAL | Status: AC
  Administered 2018-12-15: 14:00:00 650 mg via ORAL
  Filled 2018-12-15: qty 2

## 2018-12-15 NOTE — ED Provider Notes (Signed)
[facility] EMERGENCY DEPARTMENT Provider Note   CSN: [id number] Arrival date & time: 12/15/18  1117     History   Chief Complaint Chief Complaint  Patient presents with  . URI  . Hyperglycemia    HPI Sean Richards is a 33 y.o. male.  He is a history of type 1 diabetes.  He is complaining of 2 weeks of illness symptoms.  He has had a headache runny nose dry cough body aches sore throat.  He had a Covid test that resulted -4 days ago.  He saw his PCP and was given a shot of Toradol without any improvement.  His blood sugars have been going between 70-500.  Usual blood sugars around 200.  No urinary symptoms no diarrhea.     The history is provided by the patient.  URI Presenting symptoms: cough, fatigue, rhinorrhea and sore throat   Presenting symptoms: no fever   Severity:  Moderate Onset quality:  Gradual Timing:  Intermittent Progression:  Unchanged Chronicity:  New Relieved by:  Nothing Worsened by:  Nothing Ineffective treatments:  None tried Associated symptoms: headaches and myalgias   Hyperglycemia Blood sugar level PTA:  500 Severity:  Severe Onset quality:  Gradual Timing:  Intermittent Progression:  Unchanged Chronicity:  Recurrent Diabetes status:  Controlled with insulin Context: recent illness   Relieved by:  Nothing Ineffective treatments:  None tried Associated symptoms: dizziness, fatigue, malaise and shortness of breath   Associated symptoms: no abdominal pain, no chest pain, no dysuria and no fever   Risk factors: hx of DKA     Past Medical History:  Diagnosis Date  . Childhood asthma   . Diabetes mellitus type I (HCC)   . XBJYNWGN(562.1)     Patient Active Problem List   Diagnosis Date Noted  . 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 BURSITIS, LEFT KNEE 03/01/2010  . DIABETES MELLITUS, TYPE I 07/09/2009  . TOBACCO ABUSE 07/09/2009  . ASTHMA 07/09/2009  . HEADACHE 07/09/2009    Past Surgical History:  Procedure  Laterality Date  . HERNIA REPAIR          Home Medications    Prior to Admission medications   Medication Sig Start Date End Date Taking? Authorizing Provider  atorvastatin (LIPITOR) 80 MG tablet TAKE 1 TABLET(80 MG) BY MOUTH DAILY 05/23/17   [provider]  buPROPion (WELLBUTRIN XL) 150 MG 24 hr tablet Take 150 mg by mouth daily.      [provider]  escitalopram (LEXAPRO) 10 MG tablet TAKE 1 TABLET(10 MG) BY MOUTH DAILY 05/23/17   [provider]  ezetimibe (ZETIA) 10 MG tablet Take by mouth. 03/19/18   [provider]  glucose blood (ONE TOUCH TEST STRIPS) test strip Use to check blood sugar four times daily     [provider]  insulin degludec (TRESIBA FLEXTOUCH) 100 UNIT/ML SOPN FlexTouch Pen INJECT 58 UNITS UNDER THE SKIN ONCE DAILY WITH ADDITIONAL AS NEEDED( MAXIMUM DAILY DOSE IS 100 UNITS) 03/26/18   [provider]  insulin glargine (LANTUS SOLOSTAR) 100 UNIT/ML injection Inject 20-40 Units into the skin at bedtime.      [provider]  insulin glulisine (APIDRA) 100 UNIT/ML injection Inject 10-30 Units into the skin. Three times a day as directed     [provider]  Insulin Pen Needle (PEN NEEDLES 31GX5/16") 31G X 8 MM MISC Use four times a day for insulin injection     [provider]  ondansetron (ZOFRAN ODT) 4  MG disintegrating tablet Take 1 tablet (4 mg total) by mouth every 8 (eight) hours as needed for nausea or vomiting. 04/26/18   Little, Ambrose Finlandachel Morgan, MD  oseltamivir (TAMIFLU) 75 MG capsule Take 1 capsule (75 mg total) by mouth every 12 (twelve) hours. 04/25/18   Zadie RhineWickline, Donald, MD  pantoprazole (PROTONIX) 20 MG tablet Take 1 tablet (20 mg total) by mouth daily. 05/18/17   Palumbo, April, MD    Family History Family History  Problem Relation Age of Onset  . Arthritis Other   . Breast cancer Paternal Grandmother   . Hypertension Mother   . Diabetes Mother        borderline  .  Hypertension Brother   . Stroke Maternal Grandmother   . Diabetes type II Sister   . Other Other        no family history of colon cancer or prostate cancer    Social History Social History   Tobacco Use  . Smoking status: Current Every Day Smoker    Packs/day: 1.50    Types: Cigarettes, E-cigarettes  . Smokeless tobacco: Never Used  . Tobacco comment: smoking since the age of 33  Substance Use Topics  . Alcohol use: Yes    Comment: social  . Drug use: No     Allergies   Patient has no known allergies.   Review of Systems Review of Systems  Constitutional: Positive for fatigue. Negative for fever.  HENT: Positive for rhinorrhea and sore throat.   Eyes: Negative for visual disturbance.  Respiratory: Positive for cough and shortness of breath.   Cardiovascular: Negative for chest pain.  Gastrointestinal: Negative for abdominal pain.  Genitourinary: Negative for dysuria.  Musculoskeletal: Positive for myalgias.  Skin: Negative for rash.  Neurological: Positive for dizziness and headaches.     Physical Exam Updated Vital Signs BP (!) 146/81 (BP Location: Right Arm)   Pulse 82   Temp 98.6 F (37 C) (Oral)   Resp 18   Ht 6' (1.829 m)   Wt 117.9 kg   SpO2 100%   BMI 35.26 kg/m   Physical Exam Vitals signs and nursing note reviewed.  Constitutional:      Appearance: He is well-developed.  HENT:     Head: Normocephalic and atraumatic.  Eyes:     Conjunctiva/sclera: Conjunctivae normal.  Neck:     Musculoskeletal: Neck supple.  Cardiovascular:     Rate and Rhythm: Normal rate and regular rhythm.     Heart sounds: No murmur.  Pulmonary:     Effort: Pulmonary effort is normal. No respiratory distress.     Breath sounds: Normal breath sounds.  Abdominal:     Palpations: Abdomen is soft.     Tenderness: There is no abdominal tenderness.  Musculoskeletal: Normal range of motion.     Right lower leg: No edema.     Left lower leg: No edema.  Skin:     General: Skin is warm and dry.     Capillary Refill: Capillary refill takes less than 2 seconds.  Neurological:     General: No focal deficit present.     Mental Status: He is alert.     Sensory: No sensory deficit.     Motor: No weakness.     Gait: Gait normal.      ED Treatments / Results  Labs (all labs ordered are listed, but only abnormal results are displayed) Labs Reviewed  URINALYSIS, ROUTINE W REFLEX MICROSCOPIC - Abnormal; Notable for the following components:  Result Value   Color, Urine STRAW (*)    Glucose, UA >=500 (*)    All other components within normal limits  COMPREHENSIVE METABOLIC PANEL - Abnormal; Notable for the following components:   Sodium 133 (*)    Glucose, Bld 559 (*)    All other components within normal limits  CBG MONITORING, ED - Abnormal; Notable for the following components:   Glucose-Capillary 541 (*)    All other components within normal limits  POCT I-STAT EG7 - Abnormal; Notable for the following components:   Bicarbonate 28.1 (*)    All other components within normal limits  CBG MONITORING, ED - Abnormal; Notable for the following components:   Glucose-Capillary 344 (*)    All other components within normal limits  SARS CORONAVIRUS 2 (TAT 6-24 HRS)  CBC WITH DIFFERENTIAL/PLATELET  URINALYSIS, MICROSCOPIC (REFLEX)  BLOOD GAS, VENOUS    EKG None  Radiology Dg Chest Port 1 View  Result Date: 12/15/2018 CLINICAL DATA:  Cough, shortness of breath EXAM: PORTABLE CHEST 1 VIEW COMPARISON:  12/06/2018 FINDINGS: The heart size and mediastinal contours are within normal limits. No focal airspace consolidation, pleural effusion, or pneumothorax. The visualized skeletal structures are unremarkable. IMPRESSION: No acute cardiopulmonary findings. Electronically Signed   By: Duanne Guess M.D.   On: 12/15/2018 12:55    Procedures Procedures (including critical care time)  Medications Ordered in ED Medications  sodium chloride 0.9 %  bolus 1,000 mL ( Intravenous Stopped 12/15/18 1401)  insulin regular (NOVOLIN R) 100 units/mL injection 10 Units (10 Units Subcutaneous Given 12/15/18 1406)  acetaminophen (TYLENOL) tablet 650 mg (650 mg Oral Given 12/15/18 1422)  sodium chloride 0.9 % bolus 1,000 mL (0 mLs Intravenous Stopped 12/15/18 1532)     Initial Impression / Assessment and Plan / ED Course  I have reviewed the triage vital signs and the nursing notes.  Pertinent labs & imaging results that were available during my care of the patient were reviewed by me and considered in my medical decision making (see chart for details).  Clinical Course as of Dec 15 1634  Sun Dec 15, 2018  4272 33 year old male type I diabetic here with elevated blood sugars in the setting of an illness.  Differential includes Covid, pneumonia, bronchitis, viral URI, flu.  History of DKA in the past.  Sats are 100% lungs clear less likely to be pneumonia.  Getting labs IV fluids chest x-ray.   [MB]  1234 Chest x-ray reviewed by me no gross infiltrates.  Awaiting radiology reading.   [MB]  1300 Patient's labs beginning to come back.  Normal white count.  VBG shows normal pH so less likely to be DKA.  IV fluids infusing.   [MB]    Clinical Course User Index [MB] Terrilee Files, MD   Sean Richards was evaluated in Emergency Department on 12/15/2018 for the symptoms described in the history of present illness. He was evaluated in the context of the global COVID-19 pandemic, which necessitated consideration that the patient might be at risk for infection with the SARS-CoV-2 virus that causes COVID-19. Institutional protocols and algorithms that pertain to the evaluation of patients at risk for COVID-19 are in a state of rapid change based on information released by regulatory bodies including the CDC and federal and state organizations. These policies and algorithms were followed during the patient's care in the ED.      Final Clinical  Impressions(s) / ED Diagnoses   Final diagnoses:  Hyperglycemia  Person  under investigation for COVID-19    ED Discharge Orders    None       Hayden Rasmussen, MD 12/15/18 [phone]

## 2018-12-15 NOTE — ED Triage Notes (Signed)
Headache x 2 weeks. Pt has been seen and treated by PCP without relief. Negative COVID test 4 days ago. Pt also reports cough, runny nose, slight abdominal pain and body aches. Denies fever

## 2018-12-15 NOTE — Discharge Instructions (Signed)
You were seen in the emergency department for elevated blood sugars and possible Covid-like symptoms.  You had a chest x-ray blood work and urinalysis that did not show any serious findings other than elevated blood sugars.  We gave you some insulin and fluids to lower your blood sugar.  A Covid test was done and should result in the next few days.  You should isolate until your Covid test results and your symptoms improved.  Please follow-up with your doctor and return if any worsening symptoms.

## 2018-12-15 NOTE — ED Notes (Signed)
Date and time results received: 12/15/18 1325   Test:glucose Critical Value: 559 Name of Provider Notified: Melina Copa Orders Received? Or Actions Taken?: no orders given

## 2018-12-15 NOTE — ED Triage Notes (Signed)
Pt a type 1 diabetic-states that sugars have been 'high'

## 2018-12-15 NOTE — ED Provider Notes (Signed)
Patient was seen by Dr. Melina Copa.  He states his blood sugars have been high.  He is a type I diabetic.  He also has some Covid-like symptoms.  His Covid test is pending although he had a -1 4 days ago.  He has no hypoxia or shortness of breath.  He is otherwise well-appearing.  His sugars have improved with IV fluids.  It is in the 300 range now.  He has no suggestions of DKA.  He is feeling better and was discharged home with Dr. Carmie End discharge instructions.  Return precautions were given.   Malvin Johns, MD 12/15/18 1544

## 2018-12-16 MED FILL — Insulin Regular (Human) Inj 100 Unit/ML: INTRAMUSCULAR | Qty: 0.1 | Status: AC

## 2020-08-09 ENCOUNTER — Emergency Department (HOSPITAL_BASED_OUTPATIENT_CLINIC_OR_DEPARTMENT_OTHER)
Admission: EM | Admit: 2020-08-09 | Discharge: 2020-08-09 | Disposition: A | Discharge status: Home / Self Care | Payer: 59 | Attending: Emergency Medicine | Admitting: Emergency Medicine

## 2020-08-09 ENCOUNTER — Other Ambulatory Visit: Payer: Self-pay

## 2020-08-09 ENCOUNTER — Encounter (HOSPITAL_BASED_OUTPATIENT_CLINIC_OR_DEPARTMENT_OTHER): Payer: Self-pay | Admitting: *Deleted

## 2020-08-09 DIAGNOSIS — F17290 Nicotine dependence, other tobacco product, uncomplicated: Secondary | ICD-10-CM | POA: Insufficient documentation

## 2020-08-09 DIAGNOSIS — R110 Nausea: Secondary | ICD-10-CM | POA: Diagnosis not present

## 2020-08-09 DIAGNOSIS — E1065 Type 1 diabetes mellitus with hyperglycemia: Secondary | ICD-10-CM | POA: Diagnosis not present

## 2020-08-09 DIAGNOSIS — Z794 Long term (current) use of insulin: Secondary | ICD-10-CM | POA: Diagnosis not present

## 2020-08-09 DIAGNOSIS — J45909 Unspecified asthma, uncomplicated: Secondary | ICD-10-CM | POA: Insufficient documentation

## 2020-08-09 DIAGNOSIS — R739 Hyperglycemia, unspecified: Secondary | ICD-10-CM

## 2020-08-09 LAB — I-STAT VENOUS BLOOD GAS, ED
Acid-base deficit: 3 mmol/L — ABNORMAL HIGH (ref 0.0–2.0)
Bicarbonate: 22.8 mmol/L (ref 20.0–28.0)
Calcium, Ion: 1.23 mmol/L (ref 1.15–1.40)
HCT: 40 % (ref 39.0–52.0)
Hemoglobin: 13.6 g/dL (ref 13.0–17.0)
O2 Saturation: 90 %
Patient temperature: 98.2
Potassium: 4.3 mmol/L (ref 3.5–5.1)
Sodium: 134 mmol/L — ABNORMAL LOW (ref 135–145)
TCO2: 24 mmol/L (ref 22–32)
pCO2, Ven: 40.4 mmHg — ABNORMAL LOW (ref 44.0–60.0)
pH, Ven: 7.357 (ref 7.250–7.430)
pO2, Ven: 61 mmHg — ABNORMAL HIGH (ref 32.0–45.0)

## 2020-08-09 LAB — CBC
HCT: 38.2 % — ABNORMAL LOW (ref 39.0–52.0)
Hemoglobin: 13.4 g/dL (ref 13.0–17.0)
MCH: 29.7 pg (ref 26.0–34.0)
MCHC: 35.1 g/dL (ref 30.0–36.0)
MCV: 84.7 fL (ref 80.0–100.0)
Platelets: 286 10*3/uL (ref 150–400)
RBC: 4.51 MIL/uL (ref 4.22–5.81)
RDW: 13.2 % (ref 11.5–15.5)
WBC: 8.9 10*3/uL (ref 4.0–10.5)
nRBC: 0 % (ref 0.0–0.2)

## 2020-08-09 LAB — COMPREHENSIVE METABOLIC PANEL
ALT: 20 U/L (ref 0–44)
AST: 23 U/L (ref 15–41)
Albumin: 3.9 g/dL (ref 3.5–5.0)
Alkaline Phosphatase: 122 U/L (ref 38–126)
Anion gap: 12 (ref 5–15)
BUN: 20 mg/dL (ref 6–20)
CO2: 22 mmol/L (ref 22–32)
Calcium: 8.8 mg/dL — ABNORMAL LOW (ref 8.9–10.3)
Chloride: 96 mmol/L — ABNORMAL LOW (ref 98–111)
Creatinine, Ser: 0.98 mg/dL (ref 0.61–1.24)
GFR, Estimated: >60 mL/min (ref >60)
Glucose, Bld: 528 mg/dL (ref 70–99)
Potassium: 4.3 mmol/L (ref 3.5–5.1)
Sodium: 130 mmol/L — ABNORMAL LOW (ref 135–145)
Total Bilirubin: 1.2 mg/dL (ref 0.3–1.2)
Total Protein: 7.7 g/dL (ref 6.5–8.1)

## 2020-08-09 LAB — URINALYSIS, ROUTINE W REFLEX MICROSCOPIC
Bilirubin Urine: NEGATIVE
Glucose, UA: >=500 mg/dL — AB
Hgb urine dipstick: NEGATIVE
Ketones, ur: 40 mg/dL — AB
Leukocytes,Ua: NEGATIVE
Nitrite: NEGATIVE
Protein, ur: NEGATIVE mg/dL
Specific Gravity, Urine: <1.005 — ABNORMAL LOW (ref 1.005–1.030)
pH: 5.5 (ref 5.0–8.0)

## 2020-08-09 LAB — CBG MONITORING, ED
Glucose-Capillary: 511 mg/dL (ref 70–99)
Glucose-Capillary: 544 mg/dL (ref 70–99)
Glucose-Capillary: 425 mg/dL — ABNORMAL HIGH (ref 70–99)
Glucose-Capillary: 307 mg/dL — ABNORMAL HIGH (ref 70–99)

## 2020-08-09 LAB — URINALYSIS, MICROSCOPIC (REFLEX)
Squamous Epithelial / HPF: NONE SEEN (ref 0–5)
WBC, UA: NONE SEEN WBC/hpf (ref 0–5)

## 2020-08-09 MED ORDER — SODIUM CHLORIDE 0.9 % IV BOLUS
1000.0000 mL | Freq: Once | INTRAVENOUS | Status: AC
  Administered 2020-08-09: 1000 mL via INTRAVENOUS

## 2020-08-09 MED ORDER — INSULIN ASPART 100 UNIT/ML IJ SOLN
5.0000 [IU] | Freq: Once | INTRAMUSCULAR | Status: AC
  Administered 2020-08-09: 5 [IU] via INTRAVENOUS

## 2020-08-09 MED ORDER — INSULIN REGULAR HUMAN 100 UNIT/ML IJ SOLN: 5.0000 [IU] | Freq: Once | INTRAMUSCULAR | Status: DC

## 2020-08-09 MED ORDER — INSULIN REGULAR HUMAN 100 UNIT/ML IJ SOLN
5.0000 [IU] | Freq: Once | INTRAMUSCULAR | Status: DC
  Filled 2020-08-09: qty 3

## 2020-08-09 NOTE — ED Triage Notes (Signed)
States his CBG's have been reading "high" today. Nausea. Chest pressure. EKG at triage.

## 2020-08-09 NOTE — ED Notes (Signed)
ED Provider at bedside. 

## 2020-08-09 NOTE — ED Notes (Signed)
States," I feel much better"

## 2020-08-09 NOTE — ED Provider Notes (Signed)
[facility] EMERGENCY DEPARTMENT Provider Note   CSN: [id number] Arrival date & time: 08/09/20  1123     History Chief Complaint  Patient presents with   Hyperglycemia    JAYLEN KNOPE is a 35 y.o. male.  35 year old male with past medical history below including IDDM who presents with hyperglycemia.  Patient states that he has been compliant with his medications but has had persistent hyperglycemia today with meter reading "high" despite morning medications.  He has had some nausea but no vomiting.  He denies any recent illness including no cough/cold symptoms, GI symptoms, or fevers.  He does endorse polyuria and polydipsia.  They changed his insulin to [employer] brand several weeks ago due to cost issues but he states he has not been having problems with hyperglycemia until today.  The history is provided by the patient.  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   BURSITIS, LEFT KNEE 03/01/2010   DIABETES MELLITUS, TYPE I 07/09/2009   TOBACCO ABUSE 07/09/2009   ASTHMA 07/09/2009   HEADACHE 07/09/2009    Past Surgical History:  Procedure Laterality Date   HERNIA REPAIR         Family History  Problem Relation Age of Onset   Arthritis Other    Breast cancer Paternal Grandmother    Hypertension Mother    Diabetes Mother        borderline   Hypertension Brother    Stroke Maternal Grandmother    Diabetes type II Sister    Other Other        no family history of colon cancer or prostate cancer    Social History   Tobacco Use   Smoking status: Every Day    Pack years: 0.00    Types: E-cigarettes   Smokeless tobacco: Never   Tobacco comments:    smoking since the age of 76  Vaping Use   Vaping Use: Every day  Substance Use Topics   Alcohol use: Yes     Comment: social   Drug use: No    Home Medications Prior to Admission medications   Medication Sig Start Date End Date Taking? Authorizing Provider  insulin degludec (TRESIBA) 100 UNIT/ML FlexTouch Pen INJECT 58 UNITS UNDER THE SKIN ONCE DAILY WITH ADDITIONAL AS NEEDED( MAXIMUM DAILY DOSE IS 100 UNITS) 03/26/18  Yes [provider]  insulin lispro (HUMALOG) 100 UNIT/ML injection Inject 15 units qAC with additional as needed.  MDD: 50 units/day 04/27/20  Yes [provider]  pantoprazole (PROTONIX) 20 MG tablet Take 1 tablet (20 mg total) by mouth daily. 05/18/17  Yes Palumbo, April, MD  atorvastatin (LIPITOR) 80 MG tablet TAKE 1 TABLET(80 MG) BY MOUTH DAILY 05/23/17   [provider]  buPROPion (WELLBUTRIN XL) 150 MG 24 hr tablet Take 150 mg by mouth daily.      [provider]  escitalopram (LEXAPRO) 10 MG tablet TAKE 1 TABLET(10 MG) BY MOUTH DAILY 05/23/17   [provider]  ezetimibe (ZETIA) 10 MG tablet Take by mouth. 03/19/18   [provider]  glucose blood (ONE TOUCH TEST STRIPS) test strip Use to check blood sugar four times daily     [provider]  insulin glargine (LANTUS SOLOSTAR) 100 UNIT/ML injection Inject 20-40 Units into the  skin at bedtime.      [provider]  insulin glulisine (APIDRA) 100 UNIT/ML injection Inject 10-30 Units into the skin. Three times a day as directed     [provider]  Insulin Pen Needle (PEN NEEDLES 31GX5/16") 31G X 8 MM MISC Use four times a day for insulin injection     [provider]  ondansetron (ZOFRAN ODT) 4 MG disintegrating tablet Take 1 tablet (4 mg total) by mouth every 8 (eight) hours as needed for nausea or vomiting. 04/26/18   Sailor Haughn, Ambrose Finland, MD  oseltamivir (TAMIFLU) 75 MG capsule Take 1 capsule (75 mg total) by mouth every 12 (twelve) hours. 04/25/18   Zadie Rhine, MD    Allergies    Patient has no known allergies.  Review of Systems    Review of Systems All other systems reviewed and are negative except that which was mentioned in HPI  Physical Exam Updated Vital Signs BP 111/76   Pulse 86   Temp 98.2 F (36.8 C) (Oral)   Resp 17   Ht 6' (1.829 m)   Wt 103.6 kg   SpO2 100%   BMI 30.98 kg/m   Physical Exam Constitutional:      General: He is not in acute distress.    Appearance: Normal appearance.  HENT:     Head: Normocephalic and atraumatic.     Mouth/Throat:     Mouth: Mucous membranes are moist.     Pharynx: Oropharynx is clear.  Eyes:     Conjunctiva/sclera: Conjunctivae normal.  Cardiovascular:     Rate and Rhythm: Normal rate and regular rhythm.     Heart sounds: Normal heart sounds. No murmur heard. Pulmonary:     Effort: Pulmonary effort is normal.     Breath sounds: Normal breath sounds.  Abdominal:     General: Abdomen is flat. Bowel sounds are normal. There is no distension.     Palpations: Abdomen is soft.     Tenderness: There is no abdominal tenderness.  Musculoskeletal:     Right lower leg: No edema.     Left lower leg: No edema.  Skin:    General: Skin is warm and dry.  Neurological:     Mental Status: He is alert and oriented to person, place, and time.     Comments: fluent  Psychiatric:        Mood and Affect: Mood normal.        Behavior: Behavior normal.    ED Results / Procedures / Treatments   Labs (all labs ordered are listed, but only abnormal results are displayed) Labs Reviewed  COMPREHENSIVE METABOLIC PANEL - Abnormal; Notable for the following components:      Result Value   Sodium 130 (*)    Chloride 96 (*)    Glucose, Bld 528 (*)    Calcium 8.8 (*)    All other components within normal limits  CBC - Abnormal; Notable for the following components:   HCT 38.2 (*)    All other components within normal limits  URINALYSIS, ROUTINE W REFLEX MICROSCOPIC - Abnormal; Notable for the following components:   Specific Gravity, Urine <1.005 (*)    Glucose, UA  >=500 (*)    Ketones, ur 40 (*)    All other components within normal limits  URINALYSIS, MICROSCOPIC (REFLEX) - Abnormal; Notable for the following components:   Bacteria, UA RARE (*)    All other components within normal limits  CBG MONITORING, ED - Abnormal;  Notable for the following components:   Glucose-Capillary 511 (*)    All other components within normal limits  CBG MONITORING, ED - Abnormal; Notable for the following components:   Glucose-Capillary 544 (*)    All other components within normal limits  I-STAT VENOUS BLOOD GAS, ED - Abnormal; Notable for the following components:   pCO2, Ven 40.4 (*)    pO2, Ven 61.0 (*)    Acid-base deficit 3.0 (*)    Sodium 134 (*)    All other components within normal limits  CBG MONITORING, ED - Abnormal; Notable for the following components:   Glucose-Capillary 425 (*)    All other components within normal limits  CBG MONITORING, ED - Abnormal; Notable for the following components:   Glucose-Capillary 307 (*)    All other components within normal limits    EKG EKG Interpretation  Date/Time:  Monday August 09 2020 11:54:05 EDT Ventricular Rate:  99 PR Interval:  182 QRS Duration: 76 QT Interval:  348 QTC Calculation: 446 R Axis:   -6 Text Interpretation: Normal sinus rhythm Normal ECG No significant change since last tracing Confirmed by Frederick Peers [phone]) on 08/09/2020 12:50:15 PM  Radiology No results found.  Procedures Procedures   Medications Ordered in ED Medications  sodium chloride 0.9 % bolus 1,000 mL (0 mLs Intravenous Stopped 08/09/20 1343)  insulin aspart (novoLOG) injection 5 Units (5 Units Intravenous Given 08/09/20 1246)  insulin aspart (novoLOG) injection 5 Units (5 Units Intravenous Given 08/09/20 1339)  sodium chloride 0.9 % bolus 1,000 mL (0 mLs Intravenous Stopped 08/09/20 1447)    ED Course  I have reviewed the triage vital signs and the nursing notes.  Pertinent labs & imaging results that were  available during my care of the patient were reviewed by me and considered in my medical decision making (see chart for details).    MDM Rules/Calculators/A&P                          Well-appearing on exam, normal vital signs.  No acute distress.  Blood glucose in the 500s, normal venous pH and normal AG, no evidence of DKA.  Normal CBC and patient denies any infectious symptoms.  After receiving above fluids and insulin, patient's blood glucose continue to improve to 300.  He feels much better.  Discussed continuing hydration and usual medications at home including sliding scale insulin and contacting his endocrinologist for further instruction.  I have extensively reviewed return precautions and he voiced understanding. Final Clinical Impression(s) / ED Diagnoses Final diagnoses:  Hyperglycemia    Rx / DC Orders ED Discharge Orders     None        Melodie Ashworth, Ambrose Finland, MD 08/09/20 1457

## 2020-08-12 IMAGING — DX DG CHEST 1V PORT
1 series · 1 of 1 positions shown · non-contrast
Comparison: 12/06/2018

CLINICAL DATA: Cough, shortness of breath

EXAM:
PORTABLE CHEST 1 VIEW

[chest ap]
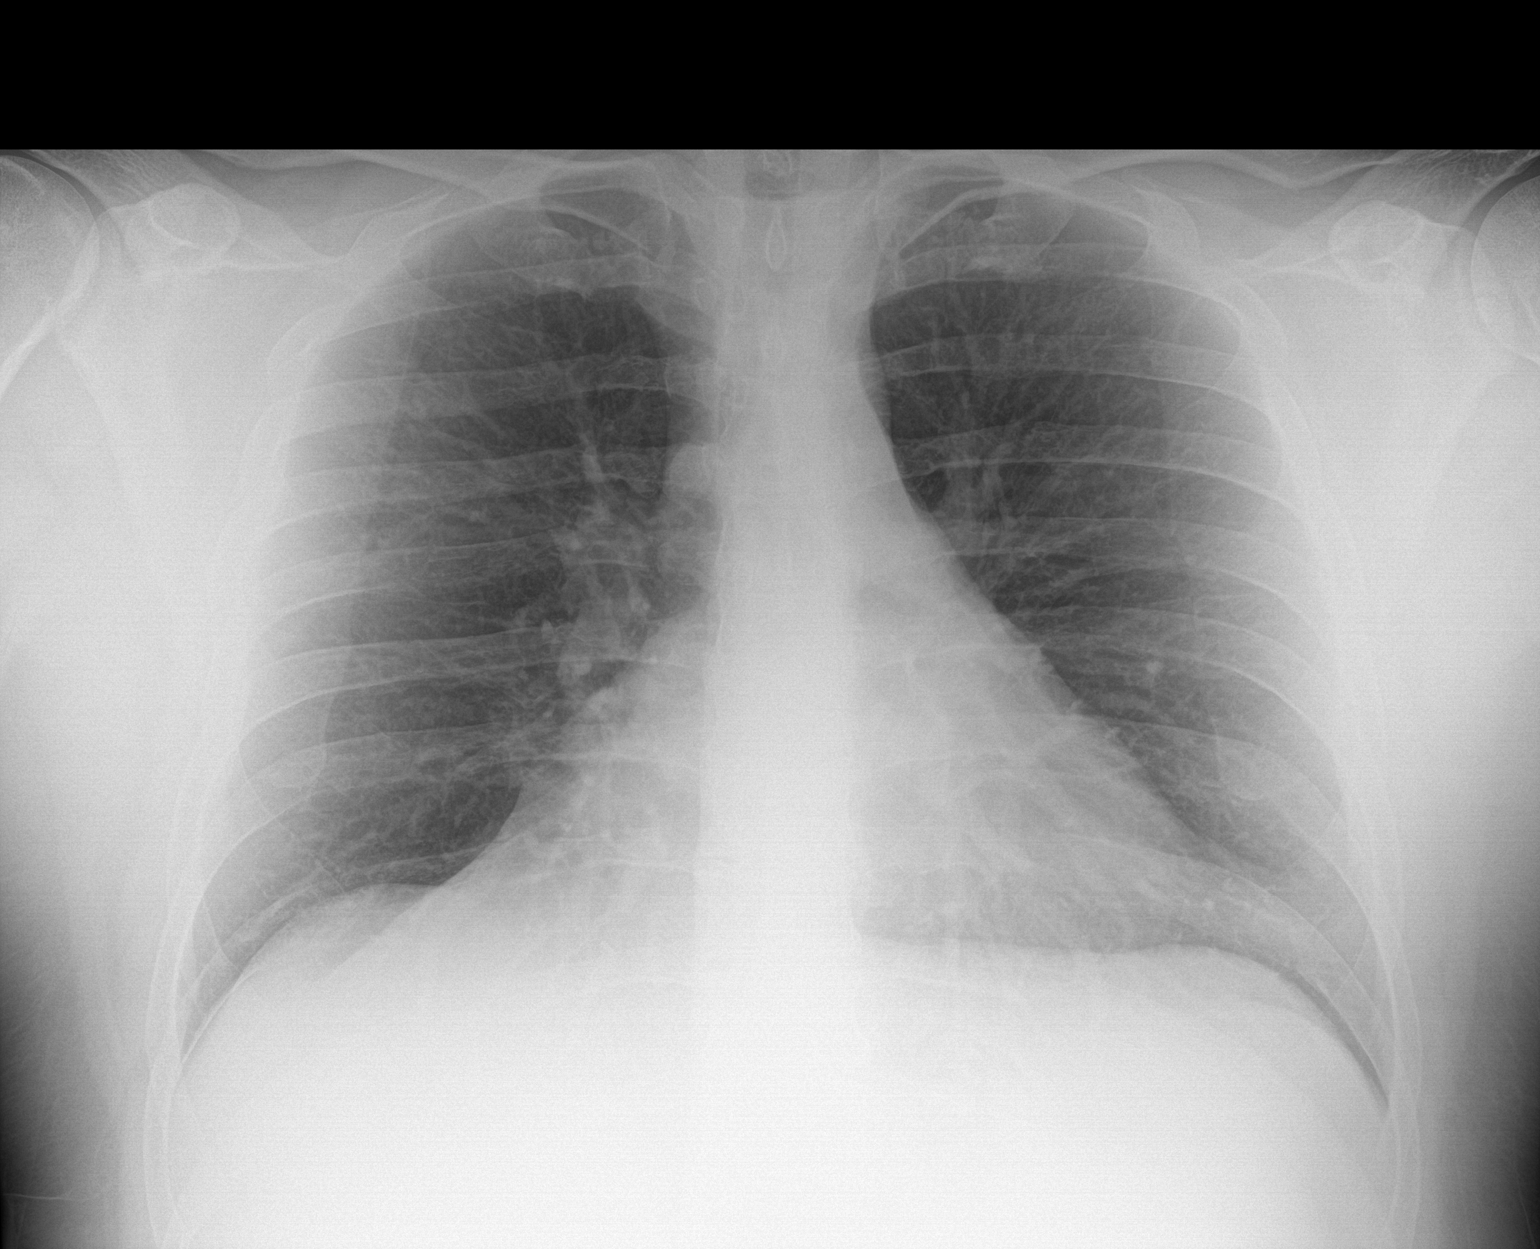

[1 of 1 positions shown; findings below may reference images not displayed]

FINDINGS: The heart size and mediastinal contours are within normal limits. No
focal airspace consolidation, pleural effusion, or pneumothorax. The
visualized skeletal structures are unremarkable.
IMPRESSION: No acute cardiopulmonary findings.

## 2021-01-29 ENCOUNTER — Emergency Department (HOSPITAL_BASED_OUTPATIENT_CLINIC_OR_DEPARTMENT_OTHER)
Admission: EM | Admit: 2021-01-29 | Discharge: 2021-01-29 | Disposition: A | Discharge status: Home / Self Care | Payer: 59 | Source: Home / Self Care | Attending: Emergency Medicine | Admitting: Emergency Medicine

## 2021-01-29 ENCOUNTER — Encounter (HOSPITAL_BASED_OUTPATIENT_CLINIC_OR_DEPARTMENT_OTHER): Payer: Self-pay | Admitting: Emergency Medicine

## 2021-01-29 ENCOUNTER — Other Ambulatory Visit: Payer: Self-pay

## 2021-01-29 ENCOUNTER — Emergency Department (HOSPITAL_BASED_OUTPATIENT_CLINIC_OR_DEPARTMENT_OTHER): Payer: 59

## 2021-01-29 ENCOUNTER — Inpatient Hospital Stay (HOSPITAL_BASED_OUTPATIENT_CLINIC_OR_DEPARTMENT_OTHER)
Admission: EM | Admit: 2021-01-29 | Discharge: 2021-01-31 | DRG: 639 | Disposition: A | Discharge status: Home / Self Care | Payer: 59 | Attending: Internal Medicine | Admitting: Internal Medicine

## 2021-01-29 DIAGNOSIS — R197 Diarrhea, unspecified: Secondary | ICD-10-CM | POA: Insufficient documentation

## 2021-01-29 DIAGNOSIS — E785 Hyperlipidemia, unspecified: Secondary | ICD-10-CM | POA: Diagnosis present

## 2021-01-29 DIAGNOSIS — E1010 Type 1 diabetes mellitus with ketoacidosis without coma: Principal | ICD-10-CM | POA: Diagnosis present

## 2021-01-29 DIAGNOSIS — E860 Dehydration: Secondary | ICD-10-CM | POA: Diagnosis present

## 2021-01-29 DIAGNOSIS — D649 Anemia, unspecified: Secondary | ICD-10-CM | POA: Diagnosis present

## 2021-01-29 DIAGNOSIS — E119 Type 2 diabetes mellitus without complications: Secondary | ICD-10-CM | POA: Insufficient documentation

## 2021-01-29 DIAGNOSIS — J45909 Unspecified asthma, uncomplicated: Secondary | ICD-10-CM | POA: Insufficient documentation

## 2021-01-29 DIAGNOSIS — R1032 Left lower quadrant pain: Secondary | ICD-10-CM | POA: Insufficient documentation

## 2021-01-29 DIAGNOSIS — F17200 Nicotine dependence, unspecified, uncomplicated: Secondary | ICD-10-CM | POA: Diagnosis present

## 2021-01-29 DIAGNOSIS — R109 Unspecified abdominal pain: Secondary | ICD-10-CM | POA: Diagnosis present

## 2021-01-29 DIAGNOSIS — F411 Generalized anxiety disorder: Secondary | ICD-10-CM | POA: Diagnosis present

## 2021-01-29 DIAGNOSIS — R112 Nausea with vomiting, unspecified: Secondary | ICD-10-CM | POA: Diagnosis present

## 2021-01-29 DIAGNOSIS — Z20822 Contact with and (suspected) exposure to covid-19: Secondary | ICD-10-CM | POA: Diagnosis present

## 2021-01-29 DIAGNOSIS — N19 Unspecified kidney failure: Secondary | ICD-10-CM | POA: Diagnosis present

## 2021-01-29 DIAGNOSIS — Z794 Long term (current) use of insulin: Secondary | ICD-10-CM | POA: Insufficient documentation

## 2021-01-29 DIAGNOSIS — Z716 Tobacco abuse counseling: Secondary | ICD-10-CM

## 2021-01-29 DIAGNOSIS — Z7985 Long-term (current) use of injectable non-insulin antidiabetic drugs: Secondary | ICD-10-CM

## 2021-01-29 DIAGNOSIS — Z833 Family history of diabetes mellitus: Secondary | ICD-10-CM

## 2021-01-29 DIAGNOSIS — Z79899 Other long term (current) drug therapy: Secondary | ICD-10-CM | POA: Insufficient documentation

## 2021-01-29 DIAGNOSIS — F17290 Nicotine dependence, other tobacco product, uncomplicated: Secondary | ICD-10-CM | POA: Diagnosis present

## 2021-01-29 DIAGNOSIS — E1110 Type 2 diabetes mellitus with ketoacidosis without coma: Secondary | ICD-10-CM | POA: Diagnosis present

## 2021-01-29 DIAGNOSIS — F32A Depression, unspecified: Secondary | ICD-10-CM | POA: Diagnosis present

## 2021-01-29 DIAGNOSIS — Z6830 Body mass index (BMI) 30.0-30.9, adult: Secondary | ICD-10-CM

## 2021-01-29 DIAGNOSIS — K529 Noninfective gastroenteritis and colitis, unspecified: Secondary | ICD-10-CM

## 2021-01-29 DIAGNOSIS — K219 Gastro-esophageal reflux disease without esophagitis: Secondary | ICD-10-CM | POA: Diagnosis present

## 2021-01-29 DIAGNOSIS — E669 Obesity, unspecified: Secondary | ICD-10-CM | POA: Diagnosis present

## 2021-01-29 LAB — COMPREHENSIVE METABOLIC PANEL
ALT: 17 U/L (ref 0–44)
ALT: 17 U/L (ref 0–44)
AST: 18 U/L (ref 15–41)
AST: 17 U/L (ref 15–41)
Albumin: 4.1 g/dL (ref 3.5–5.0)
Albumin: 3.7 g/dL (ref 3.5–5.0)
Alkaline Phosphatase: 127 U/L — ABNORMAL HIGH (ref 38–126)
Alkaline Phosphatase: 120 U/L (ref 38–126)
Anion gap: 12 (ref 5–15)
Anion gap: 15 (ref 5–15)
BUN: 24 mg/dL — ABNORMAL HIGH (ref 6–20)
BUN: 22 mg/dL — ABNORMAL HIGH (ref 6–20)
CO2: 23 mmol/L (ref 22–32)
CO2: 15 mmol/L — ABNORMAL LOW (ref 22–32)
Calcium: 8.7 mg/dL — ABNORMAL LOW (ref 8.9–10.3)
Calcium: 8.3 mg/dL — ABNORMAL LOW (ref 8.9–10.3)
Chloride: 102 mmol/L (ref 98–111)
Chloride: 102 mmol/L (ref 98–111)
Creatinine, Ser: 0.74 mg/dL (ref 0.61–1.24)
Creatinine, Ser: 0.96 mg/dL (ref 0.61–1.24)
GFR, Estimated: >60 mL/min (ref >60)
GFR, Estimated: >60 mL/min (ref >60)
Glucose, Bld: 160 mg/dL — ABNORMAL HIGH (ref 70–99)
Glucose, Bld: 439 mg/dL — ABNORMAL HIGH (ref 70–99)
Potassium: 3.6 mmol/L (ref 3.5–5.1)
Potassium: 4.1 mmol/L (ref 3.5–5.1)
Sodium: 137 mmol/L (ref 135–145)
Sodium: 132 mmol/L — ABNORMAL LOW (ref 135–145)
Total Bilirubin: 0.9 mg/dL (ref 0.3–1.2)
Total Bilirubin: 1.9 mg/dL — ABNORMAL HIGH (ref 0.3–1.2)
Total Protein: 7.8 g/dL (ref 6.5–8.1)
Total Protein: 6.9 g/dL (ref 6.5–8.1)

## 2021-01-29 LAB — CBG MONITORING, ED
Glucose-Capillary: 157 mg/dL — ABNORMAL HIGH (ref 70–99)
Glucose-Capillary: 186 mg/dL — ABNORMAL HIGH (ref 70–99)
Glucose-Capillary: 225 mg/dL — ABNORMAL HIGH (ref 70–99)
Glucose-Capillary: 324 mg/dL — ABNORMAL HIGH (ref 70–99)
Glucose-Capillary: 391 mg/dL — ABNORMAL HIGH (ref 70–99)
Glucose-Capillary: 398 mg/dL — ABNORMAL HIGH (ref 70–99)

## 2021-01-29 LAB — RESP PANEL BY RT-PCR (FLU A&B, COVID) ARPGX2
Influenza A by PCR: NEGATIVE
Influenza B by PCR: NEGATIVE
SARS Coronavirus 2 by RT PCR: NEGATIVE

## 2021-01-29 LAB — CBC
HCT: 43.4 % (ref 39.0–52.0)
Hemoglobin: 15.6 g/dL (ref 13.0–17.0)
MCH: 30.1 pg (ref 26.0–34.0)
MCHC: 35.9 g/dL (ref 30.0–36.0)
MCV: 83.6 fL (ref 80.0–100.0)
Platelets: 311 10*3/uL (ref 150–400)
RBC: 5.19 MIL/uL (ref 4.22–5.81)
RDW: 12.8 % (ref 11.5–15.5)
WBC: 8 10*3/uL (ref 4.0–10.5)
nRBC: 0 % (ref 0.0–0.2)

## 2021-01-29 LAB — I-STAT VENOUS BLOOD GAS, ED
Acid-base deficit: 10 mmol/L — ABNORMAL HIGH (ref 0.0–2.0)
Bicarbonate: 15.4 mmol/L — ABNORMAL LOW (ref 20.0–28.0)
Calcium, Ion: 1.21 mmol/L (ref 1.15–1.40)
HCT: 37 % — ABNORMAL LOW (ref 39.0–52.0)
Hemoglobin: 12.6 g/dL — ABNORMAL LOW (ref 13.0–17.0)
O2 Saturation: 89 %
Patient temperature: 98.6
Potassium: 4 mmol/L (ref 3.5–5.1)
Sodium: 132 mmol/L — ABNORMAL LOW (ref 135–145)
TCO2: 16 mmol/L — ABNORMAL LOW (ref 22–32)
pCO2, Ven: 33.4 mmHg — ABNORMAL LOW (ref 44.0–60.0)
pH, Ven: 7.273 (ref 7.250–7.430)
pO2, Ven: 63 mmHg — ABNORMAL HIGH (ref 32.0–45.0)

## 2021-01-29 LAB — CBC WITH DIFFERENTIAL/PLATELET
Abs Immature Granulocytes: 0.01 10*3/uL (ref 0.00–0.07)
Basophils Absolute: 0.1 10*3/uL (ref 0.0–0.1)
Basophils Relative: 1 %
Eosinophils Absolute: 0.2 10*3/uL (ref 0.0–0.5)
Eosinophils Relative: 3 %
HCT: 40.7 % (ref 39.0–52.0)
Hemoglobin: 14.3 g/dL (ref 13.0–17.0)
Immature Granulocytes: 0 %
Lymphocytes Relative: 13 %
Lymphs Abs: 0.8 10*3/uL (ref 0.7–4.0)
MCH: 30.2 pg (ref 26.0–34.0)
MCHC: 35.1 g/dL (ref 30.0–36.0)
MCV: 85.9 fL (ref 80.0–100.0)
Monocytes Absolute: 0.5 10*3/uL (ref 0.1–1.0)
Monocytes Relative: 8 %
Neutro Abs: 5 10*3/uL (ref 1.7–7.7)
Neutrophils Relative %: 75 %
Platelets: 252 10*3/uL (ref 150–400)
RBC: 4.74 MIL/uL (ref 4.22–5.81)
RDW: 12.8 % (ref 11.5–15.5)
WBC: 6.5 10*3/uL (ref 4.0–10.5)
nRBC: 0 % (ref 0.0–0.2)

## 2021-01-29 LAB — URINALYSIS, ROUTINE W REFLEX MICROSCOPIC
Bilirubin Urine: NEGATIVE
Glucose, UA: >=500 mg/dL — AB
Hgb urine dipstick: NEGATIVE
Ketones, ur: >=80 mg/dL — AB
Leukocytes,Ua: NEGATIVE
Nitrite: NEGATIVE
Protein, ur: NEGATIVE mg/dL
Specific Gravity, Urine: 1.025 (ref 1.005–1.030)
pH: 5.5 (ref 5.0–8.0)

## 2021-01-29 LAB — TROPONIN I (HIGH SENSITIVITY)
Troponin I (High Sensitivity): 2 ng/L (ref <18)
Troponin I (High Sensitivity): 2 ng/L (ref <18)

## 2021-01-29 LAB — URINALYSIS, MICROSCOPIC (REFLEX)
Bacteria, UA: NONE SEEN
RBC / HPF: NONE SEEN RBC/hpf (ref 0–5)
WBC, UA: NONE SEEN WBC/hpf (ref 0–5)

## 2021-01-29 LAB — LIPASE, BLOOD: Lipase: 21 U/L (ref 11–51)

## 2021-01-29 MED ORDER — ONDANSETRON 8 MG PO TBDP: ORAL_TABLET | ORAL | 0 refills | Status: DC

## 2021-01-29 MED ORDER — ONDANSETRON HCL 4 MG/2ML IJ SOLN
4.0000 mg | Freq: Once | INTRAMUSCULAR | Status: AC
  Administered 2021-01-29: 4 mg via INTRAVENOUS
  Filled 2021-01-29: qty 2

## 2021-01-29 MED ORDER — DEXTROSE-NACL 5-0.45 % IV SOLN: INTRAVENOUS | Status: DC

## 2021-01-29 MED ORDER — INSULIN REGULAR(HUMAN) IN NACL 100-0.9 UT/100ML-% IV SOLN
INTRAVENOUS | Status: DC
  Administered 2021-01-29: 13 [IU]/h via INTRAVENOUS
  Administered 2021-01-30: 03:00:00 1.2 [IU]/h via INTRAVENOUS
  Administered 2021-01-30: 02:00:00 3.2 [IU]/h via INTRAVENOUS
  Filled 2021-01-29: qty 100

## 2021-01-29 MED ORDER — POTASSIUM CHLORIDE 10 MEQ/100ML IV SOLN
10.0000 meq | INTRAVENOUS | Status: AC
  Administered 2021-01-29 (×2): 10 meq via INTRAVENOUS
  Filled 2021-01-29 (×2): qty 100

## 2021-01-29 MED ORDER — ONDANSETRON HCL 4 MG/2ML IJ SOLN
4.0000 mg | Freq: Once | INTRAMUSCULAR | Status: AC | PRN
  Administered 2021-01-29: 4 mg via INTRAVENOUS
  Filled 2021-01-29: qty 2

## 2021-01-29 MED ORDER — LACTATED RINGERS IV BOLUS
20.0000 mL/kg | Freq: Once | INTRAVENOUS | Status: AC
  Administered 2021-01-29: 2000 mL via INTRAVENOUS

## 2021-01-29 MED ORDER — ONDANSETRON HCL 4 MG/2ML IJ SOLN: 4.0000 mg | Freq: Once | INTRAMUSCULAR | Status: DC

## 2021-01-29 MED ORDER — DEXTROSE 50 % IV SOLN: 0.0000 mL | INTRAVENOUS | Status: DC | PRN

## 2021-01-29 MED ORDER — SODIUM CHLORIDE 0.9 % IV BOLUS
1000.0000 mL | Freq: Once | INTRAVENOUS | Status: AC
Start: 2021-01-29 — End: 2021-01-29
  Administered 2021-01-29: 1000 mL via INTRAVENOUS

## 2021-01-29 MED ORDER — KETOROLAC TROMETHAMINE 30 MG/ML IJ SOLN
30.0000 mg | Freq: Once | INTRAMUSCULAR | Status: AC
  Administered 2021-01-29: 30 mg via INTRAVENOUS
  Filled 2021-01-29: qty 1

## 2021-01-29 MED ORDER — IOHEXOL 300 MG/ML  SOLN
100.0000 mL | Freq: Once | INTRAMUSCULAR | Status: AC | PRN
  Administered 2021-01-29: 100 mL via INTRAVENOUS

## 2021-01-29 MED ORDER — LACTATED RINGERS IV SOLN: INTRAVENOUS | Status: DC

## 2021-01-29 MED ORDER — DEXTROSE IN LACTATED RINGERS 5 % IV SOLN: INTRAVENOUS | Status: DC

## 2021-01-29 NOTE — ED Triage Notes (Signed)
Pt states yesterday around noon he started having abd pain  Pt states since then he has had diarrhea and nausea  Pt adds that his chest has been hurting  Denies vomiting as of yet  Pt states he is a type 1 diabetic  Last CBG at home was 150

## 2021-01-29 NOTE — Discharge Instructions (Signed)
Begin taking Zofran as prescribed.  Clear liquid diet for the next 12 hours, then slowly advance as tolerated.  Return to the emergency department if symptoms significantly worsen or change.

## 2021-01-29 NOTE — ED Provider Notes (Signed)
[facility] EMERGENCY DEPARTMENT Provider Note   CSN: [id number] Arrival date & time: 01/29/21  0502     History Chief Complaint  Patient presents with   Abdominal Pain    Sean Richards is a 35 y.o. male.  Patient is a 35 year old male with past medical history of type 1 diabetes diagnosed at the age of 89.  Patient presenting today for evaluation of abdominal pain, nausea, vomiting, and diarrhea.  Patient states he presented with periumbilical abdominal pain yesterday at approximately noon.  This worsened throughout the day, then he woke up with symptoms worsening tonight.  He denies any fevers or chills.  He does report having some nonbloody diarrhea.  He denies ill contacts or having consumed any suspicious foods.  He checked his blood sugar at home and it was 150.  He tells me this is how he felt when he was in ketoacidosis before.  The history is provided by the patient.  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   BURSITIS, LEFT KNEE 03/01/2010   DIABETES MELLITUS, TYPE I 07/09/2009   TOBACCO ABUSE 07/09/2009   ASTHMA 07/09/2009   HEADACHE 07/09/2009    Past Surgical History:  Procedure Laterality Date   HERNIA REPAIR         Family History  Problem Relation Age of Onset   Arthritis Other    Breast cancer Paternal Grandmother    Hypertension Mother    Diabetes Mother        borderline   Hypertension Brother     Stroke Maternal Grandmother    Diabetes type II Sister    Other Other        no family history of colon cancer or prostate cancer    Social History   Tobacco Use   Smoking status: Every Day    Types: E-cigarettes, Cigarettes   Smokeless tobacco: Never   Tobacco comments:    smoking since the age of 26  Vaping Use   Vaping Use: Every day   Substances: Nicotine  Substance Use Topics   Alcohol use: Yes    Comment: social   Drug use: No    Home Medications Prior to Admission medications   Medication Sig Start Date End Date Taking? Authorizing Provider  atorvastatin (LIPITOR) 80 MG tablet TAKE 1 TABLET(80 MG) BY MOUTH DAILY 05/23/17   [provider]  buPROPion (WELLBUTRIN XL) 150 MG 24 hr tablet Take 150 mg by mouth daily.      [provider]  escitalopram (LEXAPRO) 10 MG tablet TAKE 1 TABLET(10 MG) BY MOUTH DAILY 05/23/17   [provider]  ezetimibe (ZETIA) 10 MG tablet Take by mouth. 03/19/18   [provider]  glucose blood (ONE TOUCH TEST STRIPS) test strip Use to check blood sugar four times daily     [provider]  insulin degludec (TRESIBA) 100 UNIT/ML FlexTouch Pen INJECT 58 UNITS UNDER THE  SKIN ONCE DAILY WITH ADDITIONAL AS NEEDED( MAXIMUM DAILY DOSE IS 100 UNITS) 03/26/18   [provider]  insulin glargine (LANTUS SOLOSTAR) 100 UNIT/ML injection Inject 20-40 Units into the skin at bedtime.      [provider]  insulin glulisine (APIDRA) 100 UNIT/ML injection Inject 10-30 Units into the skin. Three times a day as directed     [provider]  insulin lispro (HUMALOG) 100 UNIT/ML injection Inject 15 units qAC with additional as needed.  MDD: 50 units/day 04/27/20   [provider]  Insulin Pen Needle (PEN NEEDLES 31GX5/16") 31G X 8 MM MISC Use four times a day for insulin injection     [provider]  ondansetron (ZOFRAN ODT) 4 MG disintegrating tablet Take 1 tablet (4 mg total) by mouth  every 8 (eight) hours as needed for nausea or vomiting. 04/26/18   Little, Ambrose [place], MD  oseltamivir (TAMIFLU) 75 MG capsule Take 1 capsule (75 mg total) by mouth every 12 (twelve) hours. 04/25/18   Zadie Rhine, MD  pantoprazole (PROTONIX) 20 MG tablet Take 1 tablet (20 mg total) by mouth daily. 05/18/17   Palumbo, April, MD    Allergies    Patient has no known allergies.  Review of Systems   Review of Systems  Constitutional:  Negative for chills, fatigue and fever.  Gastrointestinal:  Positive for abdominal pain.  Genitourinary:  Negative for dysuria.  All other systems reviewed and are negative.  Physical Exam Updated Vital Signs BP (!) 123/98 (BP Location: Right Arm)   Pulse (!) 125   Temp 98.2 F (36.8 C) (Oral)   Resp 20   Ht 6' (1.829 m)   Wt 108.9 kg   SpO2 96%   BMI 32.55 kg/m   Physical Exam Vitals and nursing note reviewed.  Constitutional:      General: He is not in acute distress.    Appearance: He is well-developed. He is not diaphoretic.  HENT:     Head: Normocephalic and atraumatic.  Cardiovascular:     Rate and Rhythm: Normal rate and regular rhythm.     Heart sounds: No murmur heard.   No friction rub.  Pulmonary:     Effort: Pulmonary effort is normal. No respiratory distress.     Breath sounds: Normal breath sounds. No wheezing or rales.  Abdominal:     General: Bowel sounds are normal. There is no distension.     Palpations: Abdomen is soft.     Tenderness: There is abdominal tenderness in the right lower quadrant, suprapubic area and left lower quadrant. There is no right CVA tenderness, left CVA tenderness, guarding or rebound.  Musculoskeletal:        General: Normal range of motion.     Cervical back: Normal range of motion and neck supple.  Skin:    General: Skin is warm and dry.  Neurological:     Mental Status: He is alert and oriented to person, place, and time.     Coordination: Coordination normal.    ED Results /  Procedures / Treatments   Labs (all labs ordered are listed, but only abnormal results are displayed) Labs Reviewed  CBG MONITORING, ED - Abnormal; Notable for the following components:      Result Value   Glucose-Capillary 157 (*)    All other components within normal limits  LIPASE, BLOOD  COMPREHENSIVE METABOLIC PANEL  CBC  URINALYSIS, ROUTINE W REFLEX MICROSCOPIC  TROPONIN I (HIGH SENSITIVITY)    EKG  None  Radiology No results found.  Procedures Procedures   Medications Ordered in ED Medications  sodium chloride 0.9 % bolus 1,000 mL (has no administration in time range)  ondansetron (ZOFRAN) injection 4 mg (has no administration in time range)  ketorolac (TORADOL) 30 MG/ML injection 30 mg (has no administration in time range)  ondansetron (ZOFRAN) injection 4 mg (4 mg Intravenous Given 01/29/21 0521)    ED Course  I have reviewed the triage vital signs and the nursing notes.  Pertinent labs & imaging results that were available during my care of the patient were reviewed by me and considered in my medical decision making (see chart for details).    MDM Rules/Calculators/A&P  Patient presenting here with abdominal cramping and diarrhea.  He has history of type 1 diabetes, but electrolytes not indicative of DKA.  CT scan also negative for any acute intra-abdominal process.  Patient given IV fluids and medicine for nausea and seems to be feeling somewhat better.  At this point I feel as though he can safely be discharged with Zofran and as needed return.  Final Clinical Impression(s) / ED Diagnoses Final diagnoses:  None    Rx / DC Orders ED Discharge Orders     None        Geoffery Lyons, MD 01/29/21 [phone]

## 2021-01-29 NOTE — ED Triage Notes (Signed)
Pt was seen earlier this morning for abdominal pain, emesis, cp. Sx were resolved while in ED, but have since returned. Pt reports he took an at-home urine test which showed a large amount of ketones. Hx of T1DM.

## 2021-01-29 NOTE — ED Provider Notes (Addendum)
[facility] EMERGENCY DEPARTMENT Provider Note   CSN: [id number] Arrival date & time: 01/29/21  1819     History Chief Complaint  Patient presents with   Emesis   Abdominal Pain   Chest Pain   Hyperglycemia    Sean Richards is a 35 y.o. male.  HPI     Chest pain, abdominal pain, nausea, vomiting Was seen in the ED last night and sent home but after returning home symptoms consistent and is not getting any better Urine large ketones at home Friday afternoon started feeling sick, abdominal cramping center, lower abdomen, diarrhea, 6 episodes of diarrhea. No black or bloody stools No fevers No cough No congestoin/sore throat Sharp chest pain, center, nothing makes it better or worse.   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   BURSITIS, LEFT KNEE 03/01/2010   DIABETES MELLITUS, TYPE I 07/09/2009   TOBACCO ABUSE 07/09/2009   ASTHMA 07/09/2009   HEADACHE 07/09/2009    Past Surgical History:  Procedure Laterality Date   HERNIA REPAIR         Family History  Problem Relation Age of Onset   Arthritis Other    Breast cancer Paternal Grandmother    Hypertension Mother    Diabetes Mother        borderline   Hypertension Brother    Stroke Maternal Grandmother    Diabetes type II Sister    Other Other        no family history of colon cancer or prostate cancer    Social History   Tobacco Use   Smoking status: Every Day    Types: E-cigarettes, Cigarettes   Smokeless tobacco: Never   Tobacco comments:    smoking since the age of 46  Vaping Use   Vaping Use: Every day   Substances: Nicotine  Substance Use Topics   Alcohol use: Yes    Comment: social   Drug use: No    Home Medications Prior to Admission medications    Medication Sig Start Date End Date Taking? Authorizing Provider  atorvastatin (LIPITOR) 80 MG tablet TAKE 1 TABLET(80 MG) BY MOUTH DAILY 05/23/17   [provider]  buPROPion (WELLBUTRIN XL) 150 MG 24 hr tablet Take 150 mg by mouth daily.      [provider]  escitalopram (LEXAPRO) 10 MG tablet TAKE 1 TABLET(10 MG) BY MOUTH DAILY 05/23/17   [provider]  ezetimibe (ZETIA) 10 MG tablet Take by mouth. 03/19/18   [provider]  glucose blood (ONE TOUCH TEST STRIPS) test strip Use to check blood sugar four times daily     [provider]  insulin degludec (TRESIBA) 100 UNIT/ML FlexTouch Pen INJECT 58 UNITS UNDER THE SKIN ONCE DAILY WITH ADDITIONAL AS NEEDED( MAXIMUM DAILY DOSE IS 100 UNITS) 03/26/18   [provider]  insulin glargine (LANTUS SOLOSTAR) 100 UNIT/ML injection Inject 20-40 Units into the skin at bedtime.      [provider]  insulin glulisine (APIDRA) 100 UNIT/ML injection Inject 10-30 Units into the skin. Three times a day as directed     [provider]  insulin lispro (HUMALOG) 100 UNIT/ML injection Inject 15 units qAC with additional as needed.  MDD: 50 units/day 04/27/20   [provider]  Insulin Pen Needle (PEN NEEDLES 31GX5/16") 31G X 8 MM MISC Use four times a day for insulin injection     [provider]  ondansetron (ZOFRAN-ODT) 8 MG disintegrating tablet 8mg  ODT q4 hours prn nausea 01/29/21   14/3/22, MD  oseltamivir (TAMIFLU) 75 MG capsule Take 1 capsule (75 mg total) by mouth every 12 (twelve) hours. 04/25/18   04/27/18, MD  pantoprazole (PROTONIX) 20 MG tablet Take 1 tablet (20 mg total) by mouth daily. 05/18/17   Palumbo, April, MD    Allergies    Patient has no known allergies.  Review of Systems   Review of Systems  Constitutional:  Positive for appetite change and fatigue. Negative for fever.  HENT:  Negative for sore throat.   Eyes:  Negative for visual  disturbance.  Respiratory:  Negative for cough and shortness of breath.   Cardiovascular:  Negative for chest pain.  Gastrointestinal:  Positive for abdominal pain, diarrhea, nausea and vomiting.  Genitourinary:  Negative for difficulty urinating.  Musculoskeletal:  Negative for back pain and neck stiffness.  Skin:  Negative for rash.  Neurological:  Negative for syncope and headaches.   Physical Exam Updated Vital Signs BP 112/67   Pulse 96   Temp 98.6 F (37 C) (Oral)   Resp 16   SpO2 100%   Physical Exam Vitals and nursing note reviewed.  Constitutional:      General: He is not in acute distress.    Appearance: He is well-developed. He is ill-appearing. He is not diaphoretic.  HENT:     Head: Normocephalic and atraumatic.  Eyes:     Conjunctiva/sclera: Conjunctivae normal.  Cardiovascular:     Rate and Rhythm: Normal rate and regular rhythm.     Heart sounds: Normal heart sounds. No murmur heard.   No friction rub. No gallop.  Pulmonary:     Effort: Pulmonary effort is normal. No respiratory distress.     Breath sounds: Normal breath sounds. No wheezing or rales.  Abdominal:     General: There is no distension.     Palpations: Abdomen is soft.     Tenderness: There is no abdominal tenderness. There is no guarding.  Musculoskeletal:     Cervical back: Normal range of motion.  Skin:    General: Skin is warm and dry.  Neurological:     Mental Status: He is alert and oriented to person, place, and time.    ED Results / Procedures / Treatments   Labs (all labs ordered are listed, but only abnormal results are displayed) Labs Reviewed  URINALYSIS, ROUTINE W REFLEX MICROSCOPIC - Abnormal; Notable for the following components:      Result Value   Glucose, UA >=500 (*)    Ketones, ur >=80 (*)    All other components within normal limits  COMPREHENSIVE METABOLIC PANEL - Abnormal; Notable for the following components:   Sodium 132 (*)    CO2 15 (*)    Glucose, Bld  439 (*)    BUN 22 (*)    Calcium 8.3 (*)    Total Bilirubin 1.9 (*)    All other components within normal limits  BETA-HYDROXYBUTYRIC ACID - Abnormal; Notable for the following components:   Beta-Hydroxybutyric Acid 5.63 (*)    All other components within normal limits  CBG MONITORING, ED - Abnormal; Notable for the following components:   Glucose-Capillary 391 (*)    All other components within normal limits  CBG MONITORING, ED -  Abnormal; Notable for the following components:   Glucose-Capillary 398 (*)    All other components within normal limits  CBG MONITORING, ED - Abnormal; Notable for the following components:   Glucose-Capillary 324 (*)    All other components within normal limits  I-STAT VENOUS BLOOD GAS, ED - Abnormal; Notable for the following components:   pCO2, Ven 33.4 (*)    pO2, Ven 63.0 (*)    Bicarbonate 15.4 (*)    TCO2 16 (*)    Acid-base deficit 10.0 (*)    Sodium 132 (*)    HCT 37.0 (*)    Hemoglobin 12.6 (*)    All other components within normal limits  CBG MONITORING, ED - Abnormal; Notable for the following components:   Glucose-Capillary 225 (*)    All other components within normal limits  CBG MONITORING, ED - Abnormal; Notable for the following components:   Glucose-Capillary 186 (*)    All other components within normal limits  CBG MONITORING, ED - Abnormal; Notable for the following components:   Glucose-Capillary 169 (*)    All other components within normal limits  RESP PANEL BY RT-PCR (FLU A&B, COVID) ARPGX2  CBC WITH DIFFERENTIAL/PLATELET  URINALYSIS, MICROSCOPIC (REFLEX)  BETA-HYDROXYBUTYRIC ACID  BASIC METABOLIC PANEL  BETA-HYDROXYBUTYRIC ACID  TROPONIN I (HIGH SENSITIVITY)  TROPONIN I (HIGH SENSITIVITY)    EKG EKG Interpretation  Date/Time:  Saturday January 29 2021 18:49:36 EST Ventricular Rate:  112 PR Interval:  174 QRS Duration: 74 QT Interval:  324 QTC Calculation: 442 R Axis:   54 Text Interpretation: Sinus  tachycardia Low voltage QRS Borderline ECG No significant change since last tracing Confirmed by Alvira Monday ([id number]) on 01/29/2021 10:07:29 PM  Radiology CT ABDOMEN PELVIS W CONTRAST  Result Date: 01/29/2021 CLINICAL DATA:  Abdominal pain with diarrhea and nausea. EXAM: CT ABDOMEN AND PELVIS WITH CONTRAST TECHNIQUE: Multidetector CT imaging of the abdomen and pelvis was performed using the standard protocol following bolus administration of intravenous contrast. CONTRAST:  OMNIPAQUE IOHEXOL 300 MG/ML  SOLN COMPARISON:  None. FINDINGS: Lower chest: 4 mm right lower lobe pulmonary nodule identified on image 12 of series 4. Hepatobiliary: No suspicious focal abnormality within the liver parenchyma. There is no evidence for gallstones, gallbladder wall thickening, or pericholecystic fluid. No intrahepatic or extrahepatic biliary dilation. Pancreas: No focal mass lesion. No dilatation of the main duct. No intraparenchymal cyst. No peripancreatic edema. Spleen: No splenomegaly. No focal mass lesion. Adrenals/Urinary Tract: No adrenal nodule or mass. Kidneys unremarkable. No evidence for hydroureter. The urinary bladder appears normal for the degree of distention. Stomach/Bowel: Stomach is distended with food and fluid. Duodenum is normally positioned as is the ligament of Treitz. No small bowel wall thickening. No small bowel dilatation. The terminal ileum is normal. The appendix is normal. No gross colonic mass. No colonic wall thickening. Vascular/Lymphatic: No abdominal aortic aneurysm. There is no gastrohepatic or hepatoduodenal ligament lymphadenopathy. No retroperitoneal or mesenteric lymphadenopathy. No pelvic sidewall lymphadenopathy. Reproductive: The prostate gland and seminal vesicles are unremarkable. Other: No intraperitoneal free fluid. Musculoskeletal: No worrisome lytic or sclerotic osseous abnormality. IMPRESSION: 1. No acute findings in the abdomen or pelvis. Specifically, no findings to  explain the patient's history of abdominal pain and diarrhea. 2. 4 mm right lower lobe pulmonary nodule. Given patient age, this is almost assuredly benign. Electronically Signed   By: Kennith Center M.D.   On: 01/29/2021 06:57    Procedures .Critical Care Performed by: Alvira Monday, MD Authorized by: Alvira Monday,  MD   Critical care provider statement:    Critical care time (minutes):  30   Critical care was time spent personally by me on the following activities:  Development of treatment plan with patient or surrogate, evaluation of patient's response to treatment, examination of patient, ordering and review of laboratory studies, ordering and review of radiographic studies, ordering and performing treatments and interventions, pulse oximetry, re-evaluation of patient's condition and review of old charts   Medications Ordered in ED Medications  insulin regular, human (MYXREDLIN) 100 units/ 100 mL infusion (3.4 Units/hr Intravenous Rate/Dose Change 01/30/21 0029)  lactated ringers infusion ( Intravenous Not Given 01/29/21 2317)  dextrose 50 % solution 0-50 mL (has no administration in time range)  dextrose 5 %-0.45 % sodium chloride infusion ( Intravenous New Bag/Given 01/29/21 2237)  lactated ringers bolus 2,178 mL ( Intravenous Stopped 01/29/21 2225)  potassium chloride 10 mEq in 100 mL IVPB (0 mEq Intravenous Stopped 01/29/21 2228)  ondansetron (ZOFRAN) injection 4 mg (4 mg Intravenous Given 01/29/21 2158)    ED Course  I have reviewed the triage vital signs and the nursing notes.  Pertinent labs & imaging results that were available during my care of the patient were reviewed by me and considered in my medical decision making (see chart for details).    MDM Rules/Calculators/A&P                           35 year old male with history of type 1 diabetes, childhood asthma, emergency department visit earlier this morning with nausea, vomiting, abdominal pain and diarrhea for which  he had CT imaging without acute pathology, returns with continuing nausea, and vomiting that has not responded to Zofran at home, and home urine ketone test positive for large ketones.  Labs show bicarb of 15, glucose of 439, anion gap of 15, pH 7.2, beta hydroxybutyric acid of 5.63, consistent with mild diabetic ketoacidosis.  Given presence of mild DKA and failure of outpatient management of nausea and vomiting, will admit for continued care.  Placed on insulin drip.  Reported some chest pain, troponins are negative x2, feel chest pain is likely secondary to gastritis.  DKA likely triggered by viral gastroenteritis. Admitted for continued care.   Final Clinical Impression(s) / ED Diagnoses Final diagnoses:  Diabetic ketoacidosis without coma associated with type 1 diabetes mellitus [facility])    Rx / DC Orders ED Discharge Orders     None        Alvira Monday, MD 01/30/21 [id number]    Alvira Monday, MD 01/30/21 [phone]

## 2021-01-30 ENCOUNTER — Encounter (HOSPITAL_COMMUNITY): Payer: Self-pay | Admitting: Family Medicine

## 2021-01-30 DIAGNOSIS — F411 Generalized anxiety disorder: Secondary | ICD-10-CM | POA: Diagnosis present

## 2021-01-30 DIAGNOSIS — Z794 Long term (current) use of insulin: Secondary | ICD-10-CM | POA: Diagnosis not present

## 2021-01-30 DIAGNOSIS — Z716 Tobacco abuse counseling: Secondary | ICD-10-CM | POA: Diagnosis not present

## 2021-01-30 DIAGNOSIS — R1013 Epigastric pain: Secondary | ICD-10-CM | POA: Diagnosis not present

## 2021-01-30 DIAGNOSIS — E785 Hyperlipidemia, unspecified: Secondary | ICD-10-CM | POA: Diagnosis present

## 2021-01-30 DIAGNOSIS — Z6830 Body mass index (BMI) 30.0-30.9, adult: Secondary | ICD-10-CM | POA: Diagnosis not present

## 2021-01-30 DIAGNOSIS — Z20822 Contact with and (suspected) exposure to covid-19: Secondary | ICD-10-CM | POA: Diagnosis present

## 2021-01-30 DIAGNOSIS — Z79899 Other long term (current) drug therapy: Secondary | ICD-10-CM | POA: Diagnosis not present

## 2021-01-30 DIAGNOSIS — D649 Anemia, unspecified: Secondary | ICD-10-CM | POA: Diagnosis present

## 2021-01-30 DIAGNOSIS — R112 Nausea with vomiting, unspecified: Secondary | ICD-10-CM

## 2021-01-30 DIAGNOSIS — F17290 Nicotine dependence, other tobacco product, uncomplicated: Secondary | ICD-10-CM | POA: Diagnosis present

## 2021-01-30 DIAGNOSIS — N19 Unspecified kidney failure: Secondary | ICD-10-CM | POA: Diagnosis not present

## 2021-01-30 DIAGNOSIS — E1010 Type 1 diabetes mellitus with ketoacidosis without coma: Principal | ICD-10-CM

## 2021-01-30 DIAGNOSIS — R109 Unspecified abdominal pain: Secondary | ICD-10-CM | POA: Diagnosis present

## 2021-01-30 DIAGNOSIS — Z833 Family history of diabetes mellitus: Secondary | ICD-10-CM | POA: Diagnosis not present

## 2021-01-30 DIAGNOSIS — E860 Dehydration: Secondary | ICD-10-CM

## 2021-01-30 DIAGNOSIS — F32A Depression, unspecified: Secondary | ICD-10-CM | POA: Diagnosis present

## 2021-01-30 DIAGNOSIS — E1110 Type 2 diabetes mellitus with ketoacidosis without coma: Secondary | ICD-10-CM | POA: Diagnosis present

## 2021-01-30 DIAGNOSIS — E669 Obesity, unspecified: Secondary | ICD-10-CM | POA: Diagnosis present

## 2021-01-30 DIAGNOSIS — F17200 Nicotine dependence, unspecified, uncomplicated: Secondary | ICD-10-CM

## 2021-01-30 DIAGNOSIS — K219 Gastro-esophageal reflux disease without esophagitis: Secondary | ICD-10-CM | POA: Diagnosis present

## 2021-01-30 DIAGNOSIS — Z7985 Long-term (current) use of injectable non-insulin antidiabetic drugs: Secondary | ICD-10-CM | POA: Diagnosis not present

## 2021-01-30 LAB — COMPREHENSIVE METABOLIC PANEL
ALT: 14 U/L (ref 0–44)
AST: 16 U/L (ref 15–41)
Albumin: 2.8 g/dL — ABNORMAL LOW (ref 3.5–5.0)
Alkaline Phosphatase: 88 U/L (ref 38–126)
Anion gap: 6 (ref 5–15)
BUN: 13 mg/dL (ref 6–20)
CO2: 23 mmol/L (ref 22–32)
Calcium: 7.9 mg/dL — ABNORMAL LOW (ref 8.9–10.3)
Chloride: 105 mmol/L (ref 98–111)
Creatinine, Ser: 0.81 mg/dL (ref 0.61–1.24)
GFR, Estimated: >60 mL/min (ref >60)
Glucose, Bld: 145 mg/dL — ABNORMAL HIGH (ref 70–99)
Potassium: 3.8 mmol/L (ref 3.5–5.1)
Sodium: 134 mmol/L — ABNORMAL LOW (ref 135–145)
Total Bilirubin: 0.9 mg/dL (ref 0.3–1.2)
Total Protein: 5.8 g/dL — ABNORMAL LOW (ref 6.5–8.1)

## 2021-01-30 LAB — BASIC METABOLIC PANEL
Anion gap: 7 (ref 5–15)
Anion gap: 6 (ref 5–15)
BUN: 14 mg/dL (ref 6–20)
BUN: 8 mg/dL (ref 6–20)
CO2: 21 mmol/L — ABNORMAL LOW (ref 22–32)
CO2: 21 mmol/L — ABNORMAL LOW (ref 22–32)
Calcium: 8 mg/dL — ABNORMAL LOW (ref 8.9–10.3)
Calcium: 8 mg/dL — ABNORMAL LOW (ref 8.9–10.3)
Chloride: 107 mmol/L (ref 98–111)
Chloride: 108 mmol/L (ref 98–111)
Creatinine, Ser: 0.7 mg/dL (ref 0.61–1.24)
Creatinine, Ser: 0.7 mg/dL (ref 0.61–1.24)
GFR, Estimated: >60 mL/min (ref >60)
GFR, Estimated: >60 mL/min (ref >60)
Glucose, Bld: 140 mg/dL — ABNORMAL HIGH (ref 70–99)
Glucose, Bld: 173 mg/dL — ABNORMAL HIGH (ref 70–99)
Potassium: 3.5 mmol/L (ref 3.5–5.1)
Potassium: 4 mmol/L (ref 3.5–5.1)
Sodium: 135 mmol/L (ref 135–145)
Sodium: 135 mmol/L (ref 135–145)

## 2021-01-30 LAB — GLUCOSE, CAPILLARY
Glucose-Capillary: 161 mg/dL — ABNORMAL HIGH (ref 70–99)
Glucose-Capillary: 135 mg/dL — ABNORMAL HIGH (ref 70–99)
Glucose-Capillary: 157 mg/dL — ABNORMAL HIGH (ref 70–99)
Glucose-Capillary: 157 mg/dL — ABNORMAL HIGH (ref 70–99)
Glucose-Capillary: 155 mg/dL — ABNORMAL HIGH (ref 70–99)
Glucose-Capillary: 156 mg/dL — ABNORMAL HIGH (ref 70–99)
Glucose-Capillary: 177 mg/dL — ABNORMAL HIGH (ref 70–99)
Glucose-Capillary: 166 mg/dL — ABNORMAL HIGH (ref 70–99)
Glucose-Capillary: 146 mg/dL — ABNORMAL HIGH (ref 70–99)
Glucose-Capillary: 162 mg/dL — ABNORMAL HIGH (ref 70–99)
Glucose-Capillary: 124 mg/dL — ABNORMAL HIGH (ref 70–99)
Glucose-Capillary: 140 mg/dL — ABNORMAL HIGH (ref 70–99)
Glucose-Capillary: 231 mg/dL — ABNORMAL HIGH (ref 70–99)
Glucose-Capillary: 253 mg/dL — ABNORMAL HIGH (ref 70–99)

## 2021-01-30 LAB — RAPID URINE DRUG SCREEN, HOSP PERFORMED
Amphetamines: NOT DETECTED
Barbiturates: NOT DETECTED
Benzodiazepines: NOT DETECTED
Cocaine: NOT DETECTED
Opiates: NOT DETECTED
Tetrahydrocannabinol: NOT DETECTED

## 2021-01-30 LAB — CBC
HCT: 34.3 % — ABNORMAL LOW (ref 39.0–52.0)
Hemoglobin: 11.8 g/dL — ABNORMAL LOW (ref 13.0–17.0)
MCH: 30.1 pg (ref 26.0–34.0)
MCHC: 34.4 g/dL (ref 30.0–36.0)
MCV: 87.5 fL (ref 80.0–100.0)
Platelets: 215 10*3/uL (ref 150–400)
RBC: 3.92 MIL/uL — ABNORMAL LOW (ref 4.22–5.81)
RDW: 13.1 % (ref 11.5–15.5)
WBC: 4.6 10*3/uL (ref 4.0–10.5)
nRBC: 0 % (ref 0.0–0.2)

## 2021-01-30 LAB — MAGNESIUM
Magnesium: 1.8 mg/dL (ref 1.7–2.4)
Magnesium: 1.7 mg/dL (ref 1.7–2.4)

## 2021-01-30 LAB — BETA-HYDROXYBUTYRIC ACID
Beta-Hydroxybutyric Acid: 5.63 mmol/L — ABNORMAL HIGH (ref 0.05–0.27)
Beta-Hydroxybutyric Acid: 0.27 mmol/L (ref 0.05–0.27)
Beta-Hydroxybutyric Acid: 0.18 mmol/L (ref 0.05–0.27)

## 2021-01-30 LAB — HIV ANTIBODY (ROUTINE TESTING W REFLEX): HIV Screen 4th Generation wRfx: NONREACTIVE

## 2021-01-30 LAB — HEMOGLOBIN A1C
Hgb A1c MFr Bld: 9.2 % — ABNORMAL HIGH (ref 4.8–5.6)
Mean Plasma Glucose: 217.34 mg/dL

## 2021-01-30 LAB — LIPASE, BLOOD: Lipase: 22 U/L (ref 11–51)

## 2021-01-30 LAB — CBG MONITORING, ED: Glucose-Capillary: 169 mg/dL — ABNORMAL HIGH (ref 70–99)

## 2021-01-30 LAB — TROPONIN I (HIGH SENSITIVITY): Troponin I (High Sensitivity): <2 ng/L (ref <18)

## 2021-01-30 MED ORDER — ACETAMINOPHEN 650 MG RE SUPP: 650.0000 mg | Freq: Four times a day (QID) | RECTAL | Status: DC | PRN

## 2021-01-30 MED ORDER — INSULIN ASPART 100 UNIT/ML IJ SOLN
0.0000 [IU] | Freq: Every day | INTRAMUSCULAR | Status: DC
  Administered 2021-01-30: 22:00:00 3 [IU] via SUBCUTANEOUS

## 2021-01-30 MED ORDER — NALOXONE HCL 0.4 MG/ML IJ SOLN: 0.4000 mg | INTRAMUSCULAR | Status: DC | PRN

## 2021-01-30 MED ORDER — ACETAMINOPHEN 325 MG PO TABS
650.0000 mg | ORAL_TABLET | Freq: Four times a day (QID) | ORAL | Status: DC | PRN
  Administered 2021-01-30 – 2021-01-31 (×3): 650 mg via ORAL
  Filled 2021-01-30 (×3): qty 2

## 2021-01-30 MED ORDER — MAGNESIUM SULFATE 2 GM/50ML IV SOLN
2.0000 g | Freq: Once | INTRAVENOUS | Status: AC
  Administered 2021-01-30: 10:00:00 2 g via INTRAVENOUS
  Filled 2021-01-30: qty 50

## 2021-01-30 MED ORDER — PANTOPRAZOLE SODIUM 20 MG PO TBEC
20.0000 mg | DELAYED_RELEASE_TABLET | Freq: Every day | ORAL | Status: DC
  Administered 2021-01-30 – 2021-01-31 (×2): 20 mg via ORAL
  Filled 2021-01-30 (×2): qty 1

## 2021-01-30 MED ORDER — EZETIMIBE 10 MG PO TABS
10.0000 mg | ORAL_TABLET | Freq: Every day | ORAL | Status: DC
  Administered 2021-01-30 – 2021-01-31 (×2): 10 mg via ORAL
  Filled 2021-01-30 (×2): qty 1

## 2021-01-30 MED ORDER — ATORVASTATIN CALCIUM 80 MG PO TABS
80.0000 mg | ORAL_TABLET | Freq: Every day | ORAL | Status: DC
  Administered 2021-01-30 – 2021-01-31 (×2): 80 mg via ORAL
  Filled 2021-01-30 (×2): qty 1

## 2021-01-30 MED ORDER — ESCITALOPRAM OXALATE 10 MG PO TABS
10.0000 mg | ORAL_TABLET | Freq: Every day | ORAL | Status: DC
  Administered 2021-01-30 – 2021-01-31 (×2): 10 mg via ORAL
  Filled 2021-01-30 (×2): qty 1

## 2021-01-30 MED ORDER — INSULIN ASPART 100 UNIT/ML IJ SOLN
0.0000 [IU] | Freq: Three times a day (TID) | INTRAMUSCULAR | Status: DC
  Administered 2021-01-30: 16:00:00 5 [IU] via SUBCUTANEOUS
  Administered 2021-01-31: 11 [IU] via SUBCUTANEOUS

## 2021-01-30 MED ORDER — FENTANYL CITRATE PF 50 MCG/ML IJ SOSY: 25.0000 ug | PREFILLED_SYRINGE | INTRAMUSCULAR | Status: DC | PRN

## 2021-01-30 MED ORDER — INSULIN ASPART 100 UNIT/ML IJ SOLN
4.0000 [IU] | Freq: Three times a day (TID) | INTRAMUSCULAR | Status: DC
  Administered 2021-01-30 – 2021-01-31 (×3): 4 [IU] via SUBCUTANEOUS

## 2021-01-30 MED ORDER — BUPROPION HCL ER (XL) 150 MG PO TB24
150.0000 mg | ORAL_TABLET | Freq: Every day | ORAL | Status: DC
  Administered 2021-01-30 – 2021-01-31 (×2): 150 mg via ORAL
  Filled 2021-01-30 (×2): qty 1

## 2021-01-30 MED ORDER — NICOTINE 14 MG/24HR TD PT24: 14.0000 mg | MEDICATED_PATCH | Freq: Every day | TRANSDERMAL | Status: DC | PRN

## 2021-01-30 MED ORDER — INSULIN GLARGINE-YFGN 100 UNIT/ML ~~LOC~~ SOLN
15.0000 [IU] | Freq: Every day | SUBCUTANEOUS | Status: DC
  Administered 2021-01-30 – 2021-01-31 (×2): 15 [IU] via SUBCUTANEOUS
  Filled 2021-01-30 (×2): qty 0.15

## 2021-01-30 MED ORDER — KCL IN DEXTROSE-NACL 20-5-0.45 MEQ/L-%-% IV SOLN
INTRAVENOUS | Status: DC
  Filled 2021-01-30 (×2): qty 1000

## 2021-01-30 MED ORDER — ONDANSETRON HCL 4 MG/2ML IJ SOLN: 4.0000 mg | Freq: Four times a day (QID) | INTRAMUSCULAR | Status: DC | PRN

## 2021-01-30 NOTE — H&P (Signed)
History and Physical    PLEASE NOTE THAT DRAGON DICTATION SOFTWARE WAS USED IN THE CONSTRUCTION OF THIS NOTE.   Sean Richards [id number] DOB: 05-29-1985 DOA: 01/29/2021  PCP: [facility]  Patient coming from: home   I have personally briefly reviewed patient's old medical records in [place]  Chief Complaint: Nausea vomiting  HPI: Sean Richards is a 35 y.o. male with medical history significant for type 1 diabetes mellitus, GERD generalized anxiety disorder, hyperlipidemia, who is admitted to [facility] on 01/29/2021 by way of transfer from [facility] with DKA after presenting from home to the latter facility complaining of nausea/vomiting.   Patient reports 2 days of nausea/vomiting in which she is experienced 4-5 daily episodes of nonbloody, nonbilious emesis over that timeframe, with most recent episode occurring just prior to presenting back to [place] ED. in the setting, he reports very little oral intake over the last 2 days, including very little oral intake of water.  He notes associated 1 to 2 days of epigastric abdominal discomfort, which he describes as sharp, nonradiating, worse with palpation as well as with the active vomiting.  Denies any preceding trauma.  Not associate with any diarrhea, melena, hematochezia, or rash.  No recent subjective fever, chills, rigors, or generalized myalgias.  Denies any associated headache, neck stiffness, chest pain, shortness of breath, diaphoresis, palpitations, dizziness, presyncope, or syncope.  No recent cough, dysuria, gross hematuria.   The patient confirms a history of type 1 diabetes mellitus with prior episodes of DKA.  Reports compliance with his outpatient basal/short acting insulin regimen.  Per chart review, most recent hemoglobin A1c noted to be 9.2% in March 2022.  Denies any routine or recent alcohol consumption, and denies any history of recreational drug  use.  Denies any associated acute focal neurologic deficits, including no acute focal weakness, acute focal numbness, paresthesias, facial droop, dysarthria, expressive aphasia, acute change in vision, acute change in hearing, dysphagia, vertigo.  He had presented earlier in the day to [place] ED complaining of nausea/vomiting.  He received prn antiemetics, IV fluids, and some interval improvement in his nausea, and was discharged home.  Following discharge from the ED, the patient reports return of his nausea/vomiting and inability to tolerate p.o., prompting him to present back to [facility] for further evaluation and management, resulting in the following ED course:    [facility] ED Course:  Vital signs in the ED were notable for the following: Afebrile; initial heart rate 117, which subsequently proved to 90-96 following interval IV fluids; blood pressure 108/67 -122/74; respiratory rate 15-18, oxygen saturation 96 to 100% on room air.  Labs were notable for the following: Point-of-care glucose 398.  VBG 7.273/33.4 CMP notable for the following: Sodium 132, which corrects to 137 when taking into account concomitant hyperglycemia, potassium 4.1, bicarbonate 15 compared to 23 earlier in the day, BUN 22, creatinine 0.96, glucose 439, anion gap 15, compared to 12 earlier in the day, and liver enzymes found to be within normal limits.  High-sensitivity troponin high initially noted to be 2, with repeat value trending down to less than 2.  CBC notable for white blood cell count 6500.  Beta hydroxybutyric acid 5.63.  Urinalysis showed no white blood cells, no bacteria, nitrate negative, leukocyte esterase negative, greater than 500 glucose and greater than 80 ketones.  COVID-19/influenza PCR performed in the ED today and found to be  negative.  Imaging and additional notable ED work-up: EKG, in comparison to most recent prior from 08/11/2020 showed sinus tachycardia, heart rate 112,  normal intervals, no evidence of T wave changes, and less than 1 mm ST elevation in leads II, 3, aVF, which findings in leads II and aVF were unchanged relative to most recent prior EKG.  CT abdomen/pelvis with contrast showed no evidence of acute intra-abdominal or intrapelvic process, including no evidence of acute cholecystitis, choledocholithiasis, acute pancreatitis, or bowel obstruction, abscess, or perforation.  While in the ED, the following were administered: Zofran 4 mg IV x1, lactated Ringer bolus x2200 cc followed by initiation of D5 half NS at 125 cc/h, initiation of insulin drip, potassium chloride 20 mEq IV over 2 hours.  Subsequently, the patient was transferred to [facility] for admission for further evaluation and management of presenting DKA.     Review of Systems: As per HPI otherwise 10 point review of systems negative.   Past Medical History:  Diagnosis Date   Childhood asthma    Diabetes mellitus type I ([place])    Headache(784.0)     Past Surgical History:  Procedure Laterality Date   HERNIA REPAIR      Social History:  reports that he has been smoking e-cigarettes and cigarettes. He has never used smokeless tobacco. He reports current alcohol use. He reports that he does not use drugs.   No Known Allergies  Family History  Problem Relation Age of Onset   Arthritis Other    Breast cancer Paternal Grandmother    Hypertension Mother    Diabetes Mother        borderline   Hypertension Brother    Stroke Maternal Grandmother    Diabetes type II Sister    Other Other        no family history of colon cancer or prostate cancer    Family history reviewed and not pertinent    Prior to Admission medications   Medication Sig Start Date End Date Taking? Authorizing Provider  atorvastatin (LIPITOR) 80 MG tablet TAKE 1 TABLET(80 MG) BY MOUTH DAILY 05/23/17   [provider]  buPROPion (WELLBUTRIN XL) 150 MG 24 hr tablet Take 150 mg by mouth daily.       [provider]  escitalopram (LEXAPRO) 10 MG tablet TAKE 1 TABLET(10 MG) BY MOUTH DAILY 05/23/17   [provider]  ezetimibe (ZETIA) 10 MG tablet Take by mouth. 03/19/18   [provider]  glucose blood (ONE TOUCH TEST STRIPS) test strip Use to check blood sugar four times daily     [provider]  insulin degludec (TRESIBA) 100 UNIT/ML FlexTouch Pen INJECT 58 UNITS UNDER THE SKIN ONCE DAILY WITH ADDITIONAL AS NEEDED( MAXIMUM DAILY DOSE IS 100 UNITS) 03/26/18   [provider]  insulin glargine (LANTUS SOLOSTAR) 100 UNIT/ML injection Inject 20-40 Units into the skin at bedtime.      [provider]  insulin glulisine (APIDRA) 100 UNIT/ML injection Inject 10-30 Units into the skin. Three times a day as directed     [provider]  insulin lispro (HUMALOG) 100 UNIT/ML injection Inject 15 units qAC with additional as needed.  MDD: 50 units/day 04/27/20   [provider]  Insulin Pen Needle (PEN NEEDLES 31GX5/16") 31G X 8 MM MISC Use four times a day for insulin injection     [provider]  ondansetron (ZOFRAN-ODT) 8 MG disintegrating tablet 8mg  ODT q4 hours prn nausea 01/29/21   Delo,  Nathaneil Canary, MD  oseltamivir (TAMIFLU) 75 MG capsule Take 1 capsule (75 mg total) by mouth every 12 (twelve) hours. 04/25/18   Ripley Fraise, MD  pantoprazole (PROTONIX) 20 MG tablet Take 1 tablet (20 mg total) by mouth daily. 05/18/17   Palumbo, April, MD     Objective    Physical Exam: Vitals:   01/29/21 2300 01/29/21 2330 01/30/21 0000 01/30/21 0128  BP: 95/64 99/64 112/67 113/69  Pulse: 94 90 96 92  Resp: 16 15 16 18   Temp:    98.7 F (37.1 C)  TempSrc:    Oral  SpO2: 97% 98% 100% 97%    General: appears to be stated age; alert, oriented Skin: warm, dry, no rash Head:  AT/[place] Mouth:  Oral mucosa membranes appear dry, normal dentition Neck: supple; trachea midline Heart:  RRR; did not appreciate any M/R/G Lungs: CTAB, did  not appreciate any wheezes, rales, or rhonchi Abdomen: + BS; soft, ND, NT Vascular: 2+ pedal pulses b/l; 2+ radial pulses b/l Extremities: no peripheral edema, no muscle wasting Neuro: strength and sensation intact in upper and lower extremities b/l    Labs on Admission: I have personally reviewed following labs and imaging studies  CBC: Recent Labs  Lab 01/29/21 0518 01/29/21 1843 01/29/21 2106  WBC 8.0 6.5  --   NEUTROABS  --  5.0  --   HGB 15.6 14.3 12.6*  HCT 43.4 40.7 37.0*  MCV 83.6 85.9  --   PLT 311 252  --    Basic Metabolic Panel: Recent Labs  Lab 01/29/21 0518 01/29/21 1843 01/29/21 2106  NA 137 132* 132*  K 3.6 4.1 4.0  CL 102 102  --   CO2 23 15*  --   GLUCOSE 160* 439*  --   BUN 24* 22*  --   CREATININE 0.74 0.96  --   CALCIUM 8.7* 8.3*  --    GFR: Estimated Creatinine Clearance: 139.5 mL/min (by C-G formula based on SCr of 0.96 mg/dL). Liver Function Tests: Recent Labs  Lab 01/29/21 0518 01/29/21 1843  AST 18 17  ALT 17 17  ALKPHOS 127* 120  BILITOT 0.9 1.9*  PROT 7.8 6.9  ALBUMIN 4.1 3.7   Recent Labs  Lab 01/29/21 0518  LIPASE 21   No results for input(s): AMMONIA in the last 168 hours. Coagulation Profile: No results for input(s): INR, PROTIME in the last 168 hours. Cardiac Enzymes: No results for input(s): CKTOTAL, CKMB, CKMBINDEX, TROPONINI in the last 168 hours. BNP (last 3 results) No results for input(s): PROBNP in the last 8760 hours. HbA1C: No results for input(s): HGBA1C in the last 72 hours. CBG: Recent Labs  Lab 01/29/21 2126 01/29/21 2228 01/29/21 2341 01/30/21 0028 01/30/21 0149  GLUCAP 324* 225* 186* 169* 161*   Lipid Profile: No results for input(s): CHOL, HDL, LDLCALC, TRIG, CHOLHDL, LDLDIRECT in the last 72 hours. Thyroid Function Tests: No results for input(s): TSH, T4TOTAL, FREET4, T3FREE, THYROIDAB in the last 72 hours. Anemia Panel: No results for input(s): VITAMINB12, FOLATE, FERRITIN, TIBC, IRON,  RETICCTPCT in the last 72 hours. Urine analysis:    Component Value Date/Time   COLORURINE YELLOW 01/29/2021 2052   APPEARANCEUR CLEAR 01/29/2021 2052   LABSPEC 1.025 01/29/2021 2052   PHURINE 5.5 01/29/2021 2052   GLUCOSEU >=500 (A) 01/29/2021 2052   HGBUR NEGATIVE 01/29/2021 2052   BILIRUBINUR NEGATIVE 01/29/2021 2052   KETONESUR >=80 (A) 01/29/2021 2052   PROTEINUR NEGATIVE 01/29/2021 2052   UROBILINOGEN 0.2 08/02/2009 2119  NITRITE NEGATIVE 01/29/2021 2052   LEUKOCYTESUR NEGATIVE 01/29/2021 2052    Radiological Exams on Admission: CT ABDOMEN PELVIS W CONTRAST  Result Date: 01/29/2021 CLINICAL DATA:  Abdominal pain with diarrhea and nausea. EXAM: CT ABDOMEN AND PELVIS WITH CONTRAST TECHNIQUE: Multidetector CT imaging of the abdomen and pelvis was performed using the standard protocol following bolus administration of intravenous contrast. CONTRAST:  162mL OMNIPAQUE IOHEXOL 300 MG/ML  SOLN COMPARISON:  None. FINDINGS: Lower chest: 4 mm right lower lobe pulmonary nodule identified on image 12 of series 4. Hepatobiliary: No suspicious focal abnormality within the liver parenchyma. There is no evidence for gallstones, gallbladder wall thickening, or pericholecystic fluid. No intrahepatic or extrahepatic biliary dilation. Pancreas: No focal mass lesion. No dilatation of the main duct. No intraparenchymal cyst. No peripancreatic edema. Spleen: No splenomegaly. No focal mass lesion. Adrenals/Urinary Tract: No adrenal nodule or mass. Kidneys unremarkable. No evidence for hydroureter. The urinary bladder appears normal for the degree of distention. Stomach/Bowel: Stomach is distended with food and fluid. Duodenum is normally positioned as is the ligament of Treitz. No small bowel wall thickening. No small bowel dilatation. The terminal ileum is normal. The appendix is normal. No gross colonic mass. No colonic wall thickening. Vascular/Lymphatic: No abdominal aortic aneurysm. There is no gastrohepatic  or hepatoduodenal ligament lymphadenopathy. No retroperitoneal or mesenteric lymphadenopathy. No pelvic sidewall lymphadenopathy. Reproductive: The prostate gland and seminal vesicles are unremarkable. Other: No intraperitoneal free fluid. Musculoskeletal: No worrisome lytic or sclerotic osseous abnormality. IMPRESSION: 1. No acute findings in the abdomen or pelvis. Specifically, no findings to explain the patient's history of abdominal pain and diarrhea. 2. 4 mm right lower lobe pulmonary nodule. Given patient age, this is almost assuredly benign. Electronically Signed   By: Misty Stanley M.D.   On: 01/29/2021 06:57     EKG: Independently reviewed, with result as described above.    Assessment/Plan   Principal Problem:   DKA (diabetic ketoacidosis) (HCC) Active Problems:   TOBACCO ABUSE   Nausea & vomiting   Abdominal pain   Dehydration   Acute prerenal azotemia   HLD (hyperlipidemia)   GAD (generalized anxiety disorder)      #) Diabetic ketoacidosis: In the setting of a known history of poor controlled type 1 diabetes with most recent A1c of 9.2% in March 2022, prn presents to the ED with 2 days of nausea/vomiting and new onset abdominal pain, with ensuing dx of dka on the basis of presenting blood sugar of 439 associated with a presenting VBG demonstrating metabolic acidosis, while presenting CMP consistent with interval development of anion gap metabolic acidosis relative to labs performed earlier in the day, and beta hydroxybutyric acid elevated at 5.63. Additional presenting labs notable for corrected serum sodium of 137 and serum potassium of 4.1.  At this time, there are no obvious precipitating factors leading up to today's presentation of DKA.  Specifically, patient reports compliance with home insulin regimen while there are no obvious signs of infectious contribution, including UA showing no evidence of UTI and negative COVID-19/influenza PCR performed today.  Also, ACS is felt  to be less likely at this time, including presenting EKG demonstrates no evidence of acute ischemic changes relative to most recent prior EKG, while high-sensitivity troponin I found to be nonelevated and trending down x2 values.  In the emergency department, insulin drip was initiated, and patient received LR bolus of 2.2 L followed by initiation of D5 half NS after ensuing improvement in CBG to 186.  He also  received potassium chloride 20 mEq IV.    Plan: DKA protocol initiated. Insulin drip per protocol. Q1 hour Accuchecks. Q4H BMP's in order to monitor ensuing anion gap, serum sodium, serum potassium, have been ordered through 10 AM this morning.  Given improving blood sugars presenting nausea/vomiting, transition to clear liquid diet, with associated instructions to advance as tolerated to full diabetic diet.  Given most recent prior serum potassium of four-point , events KCl administered, will transition existing IV fluids to D5 half NS with 20 mEq/L Kcl.  Stat BMP and serum magnesium level ordered.  Once anion gap has closed and patient tolerating PO, will reinitiate basal insulin while continuing insulin drip for an additional two hours to prevent rebound hyperglycemia, before ultimately turning off the insulin drip. Until then, holding home insulin. Monitor on telemetry. Monitor strict I's and O's.  Check A1c. An inpatient consult to the diabetic educator has been placed. Prn IV fentanyl for abdominal pain. Prn IV Zofran. Check UDS.  Check lipase.      #) Abdominal pain: 1 to 2 days epigastric pain associated nausea/vomiting.  Appears consistent with DKA presentation, with CT abdomen/pelvis showing no evidence of acute intra-abdominal/intrapelvic process, as further detailed above.  No evidence of acute peritoneal signs on physical exam.  Differential also includes, given a documented history of such.  Also check lipase.   Plan: Further evaluation and management of presenting DKA, as above.   Prn IV fentanyl.  Temporarily change outpatient oral Protonix to IV route of administration.  Check lipase, urinary drug screen.  Repeat CMP in the morning.      #) Dehydration: Clinical and laboratory evidence to suggest dehydration in the setting of 2 days of nausea/vomiting and very limited oral intake over that timeframe, with physical exam revealing evidence of dry oral mucous membranes, while presenting labs consistent with acute prerenal azotemia.  Further exacerbation of dehydration via auto diuresis as a consequence of ensuing hyperglycemia associated with DKA.   Plan: Further evaluation management presenting DKA, including plan regarding IV fluids, as above.  Monitor strict I's and O's and daily weights.  Prn IV Zofran.  Stat BMP, serum magnesium level.  Repeat CMP in the morning.        #) GERD: Documented history of such, on oral Protonix as an outpatient.  Plan: In the setting of presenting nausea/vomiting associated with DKA, will initiate IV Protonix for now.      #) Generalized anxiety disorder: Documented history of such, on Lexapro and Wellbutrin.  In order to reduce risk for potential pro-emetic consequences of taking these medications intended, will hold home Lexapro and Wellbutrin for now.  Hold home Lexapro elevation for now, as above.       #) Hyperlipidemia: Documented history of such, on high intensity atorvastatin as an outpatient.  Plan: Hold home statin for now in the setting of DKA management, as above      #) Chronic tobacco abuse: The patient knowledges that he is a current everyday smoker.  Plan: Counseled the patient on the importance of complete smoking discontinuation..  Nicotine patch has been ordered for use during this hospitalization.      DVT prophylaxis: SCD's   Code Status: Full code Family Communication: none Disposition Plan: Per Rounding Team Consults called: none;  Admission status: inpatient    PLEASE NOTE THAT  DRAGON DICTATION SOFTWARE WAS USED IN THE CONSTRUCTION OF THIS NOTE.   Chaney Born Jermery Caratachea DO [facility]  From 7PM - 7AM  01/30/2021, 2:21 AM

## 2021-01-30 NOTE — Progress Notes (Signed)
The patient is a 35 year old obese Caucasian male with a past medical history significant for but not limited to diabetes mellitus type 1, GERD, generalized anxiety and depression, hyperlipidemia as well as other comorbidities who presented to [facility] from med [facility] with DKA after presenting from home complaining of nausea vomiting for the last few days.  He experienced 4-5 episodes of nonbloody nonbilious emesis with the most recurrent episode just presenting back to the med [facility].  He states he has had very little oral intake in the last few days and notes 1 to 2 days of epigastric abdominal pain in that hip described as sharp nonradiating worse with palpation as well as associated with vomiting.  He denies any preceding trauma.  He has a history of DKA with type I and has had prior episode of DKA previously he reports compliance with his insulin regimen and his most recent hemoglobin A1c was noted to be 9.2 back in March.  Originally presented earlier to [facility] complaint nausea vomiting and received as needed antiemetics and IV fluids and had interval improvement of his nausea and was discharged home but then following his discharge he returned to the ED with recurrent nausea vomiting with inability to tolerate p.o. prompting further management and evaluation and admission to [facility].  Patient was found to be in DKA and was initiated on insulin drip and made NPO.  Given IV fluid resuscitation and his beta hydroxybutyric acid was elevated significantly at 5.63.  CT of the abdomen pelvis done showed no acute intra-abdominal or intrapelvic processes and no evidence of acute cholecystitis, choledocholithiasis, acute pancreatitis or bowel obstruction.  In the ED he was given supportive care with Zofran lactated Ringer's boluses and was initiated on maintenance IV fluids.  Currently he is being admitted and treated for the following but not limited to:  DKA in the  setting of Diabetes Mellitus Type 1 Metabolic acidosis, mild -Poorly controlled DM Type 1 -Elevated Blood Sugar on Admission 439  -Behydroxybutyric Acid of 5.63 -Given 2.2 Liter boluses  -He has resolved and he was placed on insulin drip which is now being weaned off and will transition to long-acting 15 units of Semglee and placed on a moderate NovoLog sliding scale insulin before meals and at bedtime and 4 units of NovoLog 3 times daily with meals -Patient CO2 is now 21, anion gap is 6, chloride level is 108 -Continue to monitor blood sugars per protocol -We will need diabetes education coordinator for further evaluation -Repeat hemoglobin A1c is 9.2 -BG's ranging now from 124-166 -We will place on a carb modified diet now  Abdominal pain associate with nausea vomiting -In the setting of DKA with a CT of the abdomen pelvis showing no evidence of acute intra-abdominal/intrapelvic processes -Lipase level was checked and he was given IV fluid hydration -Pain control -Check UDS -Continue monitor and abdominal pain is improved  Dehydration -In the setting of volume depletion from his DKA from nausea vomiting and very limited oral intake -He was hyperglycemic and also likely had auto diuresis. -Given fluid boluses and placed on maintenance IV fluids with improvement -Continue supportive care and strict I's and O's  Normocytic Anemia -Patient's hemoglobin/marker is now 11.8/34.3 -Check anemia panel in the AM -Continue to monitor for signs and symptoms bleeding; currently no repeating noted -Repeat CBC in a.m.  Hyperlipidemia -Continue with atorvastatin 80 mg p.o. daily as well as azithromycin 10 mg p.o. daily  Generalized anxiety  disorder and depression -Resume home antidepressants including Lexapro and Wellbutrin -Patient takes 150 mg of bupropion p.o. daily as well as 10 mg of escitalopram p.o. daily -Currently no SI or HI  Chronic tobacco abuse -Patient acknowledges that he is  an everyday smoker -Smoking cessation counseling given  GERD -C/w PPI Pantoprazole po 20 mg daily  Obesity -Complicates overall prognosis and care -Estimated body mass index is 30.68 kg/m as calculated from the following:   Height as of this encounter: 6' (1.829 m).   Weight as of this encounter: 102.6 kg. -Weight Loss and Dietary Counseling given   We will continue to monitor the patient's clinical response to intervention and repeat blood work in the a.m. and anticipate discharge in next 24 to 48 hours

## 2021-01-30 NOTE — ED Notes (Signed)
Update regarding pts status and departure from [facility] provided to [place], [place] [id number].

## 2021-01-31 DIAGNOSIS — N19 Unspecified kidney failure: Secondary | ICD-10-CM | POA: Diagnosis not present

## 2021-01-31 DIAGNOSIS — R1013 Epigastric pain: Secondary | ICD-10-CM | POA: Diagnosis not present

## 2021-01-31 DIAGNOSIS — E860 Dehydration: Secondary | ICD-10-CM | POA: Diagnosis not present

## 2021-01-31 DIAGNOSIS — E1010 Type 1 diabetes mellitus with ketoacidosis without coma: Secondary | ICD-10-CM | POA: Diagnosis not present

## 2021-01-31 LAB — COMPREHENSIVE METABOLIC PANEL
ALT: 14 U/L (ref 0–44)
AST: 16 U/L (ref 15–41)
Albumin: 2.7 g/dL — ABNORMAL LOW (ref 3.5–5.0)
Alkaline Phosphatase: 90 U/L (ref 38–126)
Anion gap: 4 — ABNORMAL LOW (ref 5–15)
BUN: 11 mg/dL (ref 6–20)
CO2: 26 mmol/L (ref 22–32)
Calcium: 8 mg/dL — ABNORMAL LOW (ref 8.9–10.3)
Chloride: 105 mmol/L (ref 98–111)
Creatinine, Ser: 0.83 mg/dL (ref 0.61–1.24)
GFR, Estimated: >60 mL/min (ref >60)
Glucose, Bld: 295 mg/dL — ABNORMAL HIGH (ref 70–99)
Potassium: 3.9 mmol/L (ref 3.5–5.1)
Sodium: 135 mmol/L (ref 135–145)
Total Bilirubin: 0.7 mg/dL (ref 0.3–1.2)
Total Protein: 5.7 g/dL — ABNORMAL LOW (ref 6.5–8.1)

## 2021-01-31 LAB — CBC WITH DIFFERENTIAL/PLATELET
Abs Immature Granulocytes: 0.01 10*3/uL (ref 0.00–0.07)
Basophils Absolute: 0 10*3/uL (ref 0.0–0.1)
Basophils Relative: 1 %
Eosinophils Absolute: 0.2 10*3/uL (ref 0.0–0.5)
Eosinophils Relative: 4 %
HCT: 35.2 % — ABNORMAL LOW (ref 39.0–52.0)
Hemoglobin: 12.1 g/dL — ABNORMAL LOW (ref 13.0–17.0)
Immature Granulocytes: 0 %
Lymphocytes Relative: 29 %
Lymphs Abs: 1.5 10*3/uL (ref 0.7–4.0)
MCH: 29.9 pg (ref 26.0–34.0)
MCHC: 34.4 g/dL (ref 30.0–36.0)
MCV: 86.9 fL (ref 80.0–100.0)
Monocytes Absolute: 0.6 10*3/uL (ref 0.1–1.0)
Monocytes Relative: 12 %
Neutro Abs: 2.8 10*3/uL (ref 1.7–7.7)
Neutrophils Relative %: 54 %
Platelets: 218 10*3/uL (ref 150–400)
RBC: 4.05 MIL/uL — ABNORMAL LOW (ref 4.22–5.81)
RDW: 12.9 % (ref 11.5–15.5)
WBC: 5.2 10*3/uL (ref 4.0–10.5)
nRBC: 0 % (ref 0.0–0.2)

## 2021-01-31 LAB — MAGNESIUM: Magnesium: 2.1 mg/dL (ref 1.7–2.4)

## 2021-01-31 LAB — GLUCOSE, CAPILLARY
Glucose-Capillary: 326 mg/dL — ABNORMAL HIGH (ref 70–99)
Glucose-Capillary: 291 mg/dL — ABNORMAL HIGH (ref 70–99)

## 2021-01-31 LAB — PHOSPHORUS: Phosphorus: 2.9 mg/dL (ref 2.5–4.6)

## 2021-01-31 MED ORDER — NICOTINE 14 MG/24HR TD PT24: 14.0000 mg | MEDICATED_PATCH | Freq: Every day | TRANSDERMAL | 0 refills | Status: AC | PRN

## 2021-01-31 MED ORDER — INSULIN GLARGINE-YFGN 100 UNIT/ML ~~LOC~~ SOLN
20.0000 [IU] | Freq: Once | SUBCUTANEOUS | Status: AC
Start: 2021-01-31 — End: 2021-01-31
  Administered 2021-01-31: 20 [IU] via SUBCUTANEOUS
  Filled 2021-01-31: qty 0.2

## 2021-01-31 MED ORDER — INSULIN ASPART 100 UNIT/ML IJ SOLN
0.0000 [IU] | Freq: Three times a day (TID) | INTRAMUSCULAR | Status: DC
  Administered 2021-01-31: 11 [IU] via SUBCUTANEOUS

## 2021-01-31 MED ORDER — ATORVASTATIN CALCIUM 80 MG PO TABS: 80.0000 mg | ORAL_TABLET | Freq: Every day | ORAL | 0 refills | Status: AC

## 2021-01-31 MED ORDER — INSULIN ASPART 100 UNIT/ML IJ SOLN
0.0000 [IU] | Freq: Every day | INTRAMUSCULAR | Status: DC
Start: 2021-01-31 — End: 2021-01-31

## 2021-01-31 MED ORDER — INSULIN GLARGINE-YFGN 100 UNIT/ML ~~LOC~~ SOLN: 30.0000 [IU] | Freq: Every day | SUBCUTANEOUS | Status: DC

## 2021-01-31 MED ORDER — EZETIMIBE 10 MG PO TABS: 10.0000 mg | ORAL_TABLET | Freq: Every day | ORAL | 0 refills | Status: AC

## 2021-01-31 NOTE — Discharge Summary (Signed)
Physician Discharge Summary  DAIEL TESSMER [id number] DOB: 1985-09-29 DOA: 01/29/2021  PCP: [facility]  Admit date: 01/29/2021 Discharge date: 01/31/2021  Admitted From: Home Disposition:  Home  Recommendations for Outpatient Follow-up:  Follow up with PCP in 1-2 weeks Follow-up with endocrinology within 1 to 2 weeks Please obtain CMP/CBC,Mag, Phos in one week Please follow up on the following pending results:  Home Health: No  Equipment/Devices: None    Discharge Condition: Stable  CODE STATUS: FULL CODE  Diet recommendation: Carb Modified Deit   Brief/Interim Summary: The patient is a 35 year old obese Caucasian male with a past medical history significant for but not limited to diabetes mellitus type 1, GERD, generalized anxiety and depression, hyperlipidemia as well as other comorbidities who presented to [facility] from [place] with DKA after presenting from home complaining of nausea vomiting for the last few days.  He experienced 4-5 episodes of nonbloody nonbilious emesis with the most recurrent episode just presenting back to the [facility].  He states he has had very little oral intake in the last few days and notes 1 to 2 days of epigastric abdominal pain in that hip described as sharp nonradiating worse with palpation as well as associated with vomiting.  He denies any preceding trauma.  He has a history of DKA with type I and has had prior episode of DKA previously he reports compliance with his insulin regimen and his most recent hemoglobin A1c was noted to be 9.2 back in March.  Originally presented earlier to [employer] complaint nausea vomiting and received as needed antiemetics and IV fluids and had interval improvement of his nausea and was discharged home but then following his discharge he returned to the ED with recurrent nausea vomiting with inability to tolerate p.o. prompting further management and  evaluation and admission to [facility].  Patient was found to be in DKA and was initiated on insulin drip and made NPO.  Given IV fluid resuscitation and his beta hydroxybutyric acid was elevated significantly at 5.63.  CT of the abdomen pelvis done showed no acute intra-abdominal or intrapelvic processes and no evidence of acute cholecystitis, choledocholithiasis, acute pancreatitis or bowel obstruction.  In the ED he was given supportive care with Zofran lactated Ringer's boluses and was initiated on maintenance IV fluids  Discharge Diagnoses:  Principal Problem:   DKA (diabetic ketoacidosis) ([place]) Active Problems:   TOBACCO ABUSE   Nausea & vomiting   Abdominal pain   Dehydration   Acute prerenal azotemia   HLD (hyperlipidemia)   GAD (generalized anxiety disorder)  DKA in the setting of Diabetes Mellitus Type 1 Metabolic acidosis, mild -Poorly controlled DM Type 1 -Elevated Blood Sugar on Admission 439  -Behydroxybutyric Acid of 5.63 -Given 2.2 Liter boluses  -He has resolved and he was placed on insulin drip which is now being weaned off and will transition to long-acting 15 units of Semglee and placed on a moderate NovoLog sliding scale insulin before meals and at bedtime and 4 units of NovoLog 3 times daily with meals; his insulin regimen was further adjusted and was given 35 units prior to discharge and will be resumed on his home regimen -Patient's metabolic acidosis improved and he had a CO2 26, anion gap of 4, chloride level of 105 -Continue to monitor blood sugars per protocol -We will need diabetes education coordinator for further evaluation -Repeat hemoglobin A1c is 9.2 -BG's ranging now from 140-326 -We  will place on a carb modified diet now and resume his home meds at discharge; will add a freestyle libre for the patient and he will need to follow-up with his endocrinologist at discharge -His nausea vomiting and abdominal pain have resolved and he is back to his  baseline   Abdominal pain associate with nausea vomiting -In the setting of DKA with a CT of the abdomen pelvis showing no evidence of acute intra-abdominal/intrapelvic processes -Lipase level was checked and he was given IV fluid hydration -Pain control and his abdominal pain improved -Check UDS and was negative -Continue monitor and abdominal pain is improved   Dehydration -In the setting of volume depletion from his DKA from nausea vomiting and very limited oral intake -He was hyperglycemic and also likely had auto diuresis. -Given fluid boluses and placed on maintenance IV fluids with improvement and is back to being euvolemic -Continue supportive care and strict I's and O's   Normocytic Anemia -Patient's hemoglobin/hematocrit went from 11.8/34.3 and is now 12.1/35.2 -Check anemia panel in the outpatient setting -Continue to monitor for signs and symptoms bleeding; currently no repeating noted -Repeat CBC in a.m.   Hyperlipidemia -Continue with atorvastatin 80 mg p.o. daily as well as Zetia 10 mg p.o. daily and have renewed the prescription   Generalized anxiety disorder and depression -Resume home antidepressants including Lexapro and Wellbutrin but patient has not taken these in months so we have discontinued this -Patient reportedly was taking 150 mg of bupropion p.o. daily as well as 10 mg of escitalopram p.o. daily but no longer and so we have discontinued this -Currently no SI or HI   Chronic tobacco abuse -Patient acknowledges that he is an everyday smoker -Smoking cessation counseling given and will be prescribed a nicotine patch   GERD -C/w PPI Pantoprazole po 20 mg daily   Obesity -Complicates overall prognosis and care -Estimated body mass index is 30.68 kg/m as calculated from the following:   Height as of this encounter: 6' (1.829 m).   Weight as of this encounter: 102.6 kg. -Weight Loss and Dietary Counseling given   Discharge Instructions  Discharge  Instructions     Call MD for:  difficulty breathing, headache or visual disturbances   Complete by: As directed    Call MD for:  extreme fatigue   Complete by: As directed    Call MD for:  hives   Complete by: As directed    Call MD for:  persistant dizziness or light-headedness   Complete by: As directed    Call MD for:  persistant nausea and vomiting   Complete by: As directed    Call MD for:  redness, tenderness, or signs of infection (pain, swelling, redness, odor or green/yellow discharge around incision site)   Complete by: As directed    Call MD for:  severe uncontrolled pain   Complete by: As directed    Call MD for:  temperature >100.4   Complete by: As directed    Diet - low sodium heart healthy   Complete by: As directed    Diet Carb Modified   Complete by: As directed    Discharge instructions   Complete by: As directed    You were cared for by a hospitalist during your hospital stay. If you have any questions about your discharge medications or the care you received while you were in the hospital after you are discharged, you can call the unit and ask to speak with the hospitalist on call  if the hospitalist that took care of you is not available. Once you are discharged, your primary care physician will handle any further medical issues. Please note that NO REFILLS for any discharge medications will be authorized once you are discharged, as it is imperative that you return to your primary care physician (or establish a relationship with a primary care physician if you do not have one) for your aftercare needs so that they can reassess your need for medications and monitor your lab values.  Follow up with PCP and Endocrinologist within 1-2 weeks. Take all medications as prescribed. If symptoms change or worsen please return to the ED for evaluation   Increase activity slowly   Complete by: As directed       Allergies as of 01/31/2021   No Known Allergies       Medication List     TAKE these medications    aspirin-acetaminophen-caffeine 250-250-65 MG tablet Commonly known as: EXCEDRIN MIGRAINE Take 2 tablets by mouth every 6 (six) hours as needed for headache.   atorvastatin 80 MG tablet Commonly known as: LIPITOR Take 1 tablet (80 mg total) by mouth daily. Start taking on: February 01, 2021 What changed: See the new instructions.   calcium carbonate 500 MG chewable tablet Commonly known as: TUMS - dosed in mg elemental calcium Chew 2 tablets by mouth daily as needed for indigestion or heartburn.   CO Q 10 PO Take 2 tablets by mouth at bedtime.   dorzolamide-timolol 22.3-6.8 MG/ML ophthalmic solution Commonly known as: COSOPT Place 1 drop into both eyes in the morning and at bedtime.   ezetimibe 10 MG tablet Commonly known as: ZETIA Take 1 tablet (10 mg total) by mouth daily. Start taking on: February 01, 2021 What changed:  how much to take when to take this   ibuprofen 200 MG tablet Commonly known as: ADVIL Take 200 mg by mouth every 6 (six) hours as needed for mild pain or headache.   insulin glargine 100 UNIT/ML injection Commonly known as: LANTUS Inject 52 Units into the skin at bedtime.   nicotine 14 mg/24hr patch Commonly known as: NICODERM CQ - dosed in mg/24 hours Place 1 patch (14 mg total) onto the skin daily as needed (smoking cessation).   NovoLOG 100 UNIT/ML injection Generic drug: insulin aspart Inject 5-25 Units into the skin See admin instructions. 4 to 5 times a day with a meal. Per sliding scale   pantoprazole 20 MG tablet Commonly known as: PROTONIX Take 1 tablet (20 mg total) by mouth daily.        Follow-up Information     [facility]. Call.   Specialty: Family Medicine Why: Follow up within 1-2 weeks Contact information: [address] [place] [address] [phone]                No Known Allergies  Consultations: Diabetes education  coordinator  Procedures/Studies: CT ABDOMEN PELVIS W CONTRAST  Result Date: 01/29/2021 CLINICAL DATA:  Abdominal pain with diarrhea and nausea. EXAM: CT ABDOMEN AND PELVIS WITH CONTRAST TECHNIQUE: Multidetector CT imaging of the abdomen and pelvis was performed using the standard protocol following bolus administration of intravenous contrast. CONTRAST:  115mL OMNIPAQUE IOHEXOL 300 MG/ML  SOLN COMPARISON:  None. FINDINGS: Lower chest: 4 mm right lower lobe pulmonary nodule identified on image 12 of series 4. Hepatobiliary: No suspicious focal abnormality within the liver parenchyma. There is no evidence for gallstones, gallbladder wall thickening, or pericholecystic fluid. No intrahepatic  or extrahepatic biliary dilation. Pancreas: No focal mass lesion. No dilatation of the main duct. No intraparenchymal cyst. No peripancreatic edema. Spleen: No splenomegaly. No focal mass lesion. Adrenals/Urinary Tract: No adrenal nodule or mass. Kidneys unremarkable. No evidence for hydroureter. The urinary bladder appears normal for the degree of distention. Stomach/Bowel: Stomach is distended with food and fluid. Duodenum is normally positioned as is the ligament of Treitz. No small bowel wall thickening. No small bowel dilatation. The terminal ileum is normal. The appendix is normal. No gross colonic mass. No colonic wall thickening. Vascular/Lymphatic: No abdominal aortic aneurysm. There is no gastrohepatic or hepatoduodenal ligament lymphadenopathy. No retroperitoneal or mesenteric lymphadenopathy. No pelvic sidewall lymphadenopathy. Reproductive: The prostate gland and seminal vesicles are unremarkable. Other: No intraperitoneal free fluid. Musculoskeletal: No worrisome lytic or sclerotic osseous abnormality. IMPRESSION: 1. No acute findings in the abdomen or pelvis. Specifically, no findings to explain the patient's history of abdominal pain and diarrhea. 2. 4 mm right lower lobe pulmonary nodule. Given patient age,  this is almost assuredly benign. Electronically Signed   By: Misty Stanley M.D.   On: 01/29/2021 06:57     Subjective: Seen and examined at bedside and patient was doing much better and was back to his baseline.  Tolerated his diet without issues and had no nausea or vomiting.  No other concerns requested time is ready to go home.  Discharge Exam: Vitals:   01/31/21 0853 01/31/21 1352  BP: 132/74 123/81  Pulse: 87 98  Resp: 18 18  Temp: 97.7 F (36.5 C) 98 F (36.7 C)  SpO2: 99% 98%   Vitals:   01/31/21 0030 01/31/21 0533 01/31/21 0853 01/31/21 1352  BP: 124/75 128/81 132/74 123/81  Pulse: 90 75 87 98  Resp: 18 18 18 18   Temp: 98.1 F (36.7 C) 97.8 F (36.6 C) 97.7 F (36.5 C) 98 F (36.7 C)  TempSrc: Oral Oral Oral Oral  SpO2: 96% 98% 99% 98%  Weight:  100.5 kg    Height:       General: Pt is alert, awake, not in acute distress Cardiovascular: RRR, S1/S2 +, no rubs, no gallops Respiratory: Slight diminished bilaterally, no wheezing, no rhonchi; unlabored breathing Abdominal: Soft, NT, distended secondary body habitus, bowel sounds + Extremities: no edema, no cyanosis  The results of significant diagnostics from this hospitalization (including imaging, microbiology, ancillary and laboratory) are listed below for reference.    Microbiology: Recent Results (from the past 240 hour(s))  Resp Panel by RT-PCR (Flu A&B, Covid) Nasopharyngeal Swab     Status: None   Collection Time: 01/29/21 10:00 PM   Specimen: Nasopharyngeal Swab; Nasopharyngeal(NP) swabs in vial transport medium  Result Value Ref Range Status   SARS Coronavirus 2 by RT PCR NEGATIVE NEGATIVE Final    Comment: (NOTE) SARS-CoV-2 target nucleic acids are NOT DETECTED.  The SARS-CoV-2 RNA is generally detectable in upper respiratory specimens during the acute phase of infection. The lowest concentration of SARS-CoV-2 viral copies this assay can detect is 138 copies/mL. A negative result does not preclude  SARS-Cov-2 infection and should not be used as the sole basis for treatment or other patient management decisions. A negative result may occur with  improper specimen collection/handling, submission of specimen other than nasopharyngeal swab, presence of viral mutation(s) within the areas targeted by this assay, and inadequate number of viral copies(<138 copies/mL). A negative result must be combined with clinical observations, patient history, and epidemiological information. The expected result is Negative.  Fact Sheet for  Patients:  EntrepreneurPulse.com.au  Fact Sheet for Healthcare Providers:  IncredibleEmployment.be  This test is no t yet approved or cleared by the [place] FDA and  has been authorized for detection and/or diagnosis of SARS-CoV-2 by FDA under an Emergency Use Authorization (EUA). This EUA will remain  in effect (meaning this test can be used) for the duration of the COVID-19 declaration under Section 564(b)(1) of the Act, 21 U.S.C.section 360bbb-3(b)(1), unless the authorization is terminated  or revoked sooner.       Influenza A by PCR NEGATIVE NEGATIVE Final   Influenza B by PCR NEGATIVE NEGATIVE Final    Comment: (NOTE) The Xpert Xpress SARS-CoV-2/FLU/RSV plus assay is intended as an aid in the diagnosis of influenza from Nasopharyngeal swab specimens and should not be used as a sole basis for treatment. Nasal washings and aspirates are unacceptable for Xpert Xpress SARS-CoV-2/FLU/RSV testing.  Fact Sheet for Patients: EntrepreneurPulse.com.au  Fact Sheet for Healthcare Providers: IncredibleEmployment.be  This test is not yet approved or cleared by the [place] FDA and has been authorized for detection and/or diagnosis of SARS-CoV-2 by FDA under an Emergency Use Authorization (EUA). This EUA will remain in effect (meaning this test can be used) for the duration of  the COVID-19 declaration under Section 564(b)(1) of the Act, 21 U.S.C. section 360bbb-3(b)(1), unless the authorization is terminated or revoked.  Performed at [facility], [address] [place], [place] [address]     Labs: BNP (last 3 results) No results for input(s): BNP in the last 8760 hours. Basic Metabolic Panel: Recent Labs  Lab 01/29/21 1843 01/29/21 2106 01/30/21 0317 01/30/21 0546 01/30/21 1127 01/31/21 0217  NA 132* 132* 135 134* 135 135  K 4.1 4.0 3.5 3.8 4.0 3.9  CL 102  --  107 105 108 105  CO2 15*  --  21* 23 21* 26  GLUCOSE 439*  --  140* 145* 173* 295*  BUN 22*  --  14 13 8 11   CREATININE 0.96  --  0.70 0.81 0.70 0.83  CALCIUM 8.3*  --  8.0* 7.9* 8.0* 8.0*  MG  --   --  1.8 1.7  --  2.1  PHOS  --   --   --   --   --  2.9   Liver Function Tests: Recent Labs  Lab 01/29/21 0518 01/29/21 1843 01/30/21 0546 01/31/21 0217  AST 18 17 16 16   ALT 17 17 14 14   ALKPHOS 127* 120 88 90  BILITOT 0.9 1.9* 0.9 0.7  PROT 7.8 6.9 5.8* 5.7*  ALBUMIN 4.1 3.7 2.8* 2.7*   Recent Labs  Lab 01/29/21 0518 01/30/21 0546  LIPASE 21 22   No results for input(s): AMMONIA in the last 168 hours. CBC: Recent Labs  Lab 01/29/21 0518 01/29/21 1843 01/29/21 2106 01/30/21 0546 01/31/21 0217  WBC 8.0 6.5  --  4.6 5.2  NEUTROABS  --  5.0  --   --  2.8  HGB 15.6 14.3 12.6* 11.8* 12.1*  HCT 43.4 40.7 37.0* 34.3* 35.2*  MCV 83.6 85.9  --  87.5 86.9  PLT 311 252  --  215 218   Cardiac Enzymes: No results for input(s): CKTOTAL, CKMB, CKMBINDEX, TROPONINI in the last 168 hours. BNP: Invalid input(s): POCBNP CBG: Recent Labs  Lab 01/30/21 1454 01/30/21 1604 01/30/21 2151 01/31/21 0742 01/31/21 1140  GLUCAP 140* 231* 253* 326* 291*   D-Dimer No results for input(s): DDIMER in the last 72 hours. Hgb  A1c Recent Labs    01/30/21 0317  HGBA1C 9.2*   Lipid Profile No results for input(s): CHOL, HDL, LDLCALC, TRIG, CHOLHDL, LDLDIRECT in the last  72 hours. Thyroid function studies No results for input(s): TSH, T4TOTAL, T3FREE, THYROIDAB in the last 72 hours.  Invalid input(s): FREET3 Anemia work up No results for input(s): VITAMINB12, FOLATE, FERRITIN, TIBC, IRON, RETICCTPCT in the last 72 hours. Urinalysis    Component Value Date/Time   COLORURINE YELLOW 01/29/2021 2052   APPEARANCEUR CLEAR 01/29/2021 2052   LABSPEC 1.025 01/29/2021 2052   PHURINE 5.5 01/29/2021 2052   GLUCOSEU >=500 (A) 01/29/2021 2052   HGBUR NEGATIVE 01/29/2021 2052   BILIRUBINUR NEGATIVE 01/29/2021 2052   KETONESUR >=80 (A) 01/29/2021 2052   PROTEINUR NEGATIVE 01/29/2021 2052   UROBILINOGEN 0.2 08/02/2009 2119   NITRITE NEGATIVE 01/29/2021 2052   LEUKOCYTESUR NEGATIVE 01/29/2021 2052   Sepsis Labs Invalid input(s): PROCALCITONIN,  WBC,  LACTICIDVEN Microbiology Recent Results (from the past 240 hour(s))  Resp Panel by RT-PCR (Flu A&B, Covid) Nasopharyngeal Swab     Status: None   Collection Time: 01/29/21 10:00 PM   Specimen: Nasopharyngeal Swab; Nasopharyngeal(NP) swabs in vial transport medium  Result Value Ref Range Status   SARS Coronavirus 2 by RT PCR NEGATIVE NEGATIVE Final    Comment: (NOTE) SARS-CoV-2 target nucleic acids are NOT DETECTED.  The SARS-CoV-2 RNA is generally detectable in upper respiratory specimens during the acute phase of infection. The lowest concentration of SARS-CoV-2 viral copies this assay can detect is 138 copies/mL. A negative result does not preclude SARS-Cov-2 infection and should not be used as the sole basis for treatment or other patient management decisions. A negative result may occur with  improper specimen collection/handling, submission of specimen other than nasopharyngeal swab, presence of viral mutation(s) within the areas targeted by this assay, and inadequate number of viral copies(<138 copies/mL). A negative result must be combined with clinical observations, patient history, and  epidemiological information. The expected result is Negative.  Fact Sheet for Patients:  EntrepreneurPulse.com.au  Fact Sheet for Healthcare Providers:  IncredibleEmployment.be  This test is no t yet approved or cleared by the [place] FDA and  has been authorized for detection and/or diagnosis of SARS-CoV-2 by FDA under an Emergency Use Authorization (EUA). This EUA will remain  in effect (meaning this test can be used) for the duration of the COVID-19 declaration under Section 564(b)(1) of the Act, 21 U.S.C.section 360bbb-3(b)(1), unless the authorization is terminated  or revoked sooner.       Influenza A by PCR NEGATIVE NEGATIVE Final   Influenza B by PCR NEGATIVE NEGATIVE Final    Comment: (NOTE) The Xpert Xpress SARS-CoV-2/FLU/RSV plus assay is intended as an aid in the diagnosis of influenza from Nasopharyngeal swab specimens and should not be used as a sole basis for treatment. Nasal washings and aspirates are unacceptable for Xpert Xpress SARS-CoV-2/FLU/RSV testing.  Fact Sheet for Patients: EntrepreneurPulse.com.au  Fact Sheet for Healthcare Providers: IncredibleEmployment.be  This test is not yet approved or cleared by the [place] FDA and has been authorized for detection and/or diagnosis of SARS-CoV-2 by FDA under an Emergency Use Authorization (EUA). This EUA will remain in effect (meaning this test can be used) for the duration of the COVID-19 declaration under Section 564(b)(1) of the Act, 21 U.S.C. section 360bbb-3(b)(1), unless the authorization is terminated or revoked.  Performed at [facility], [address] [place], [place] [address]    Time coordinating discharge:  35 minutes  SIGNED:  Merlene Laughter, DO [facility] 01/31/2021, 7:19 PM Pager is on AMION  If 7PM-7AM, please contact night-coverage [email or web address]

## 2021-01-31 NOTE — Progress Notes (Signed)
Inpatient Diabetes Program Recommendations  AACE/ADA: New Consensus Statement on Inpatient Glycemic Control (2015)  Target Ranges:  Prepandial:   less than 140 mg/dL      Peak postprandial:   less than 180 mg/dL (1-2 hours)      Critically ill patients:  140 - 180 mg/dL   Lab Results  Component Value Date   GLUCAP 291 (H) 01/31/2021   HGBA1C 9.2 (H) 01/30/2021    Review of Glycemic Control  Latest Reference Range & Units 01/30/21 11:52 01/30/21 13:47 01/30/21 14:54 01/30/21 16:04 01/30/21 21:51 01/31/21 07:42 01/31/21 11:40  Glucose-Capillary 70 - 99 mg/dL 836 (H) 629 (H) 476 (H) 231 (H) 253 (H) 326 (H) 291 (H)   Diabetes history: DM 1 Outpatient Diabetes medications:  Lantus 52 units q HS, Novolog 5-25 units 4-5 times a day Current orders for Inpatient glycemic control:  Novolog resistant tid with meals and HS Novolog 4 units tid with meals Semglee 30 units daily (starts 02/01/21) Inpatient Diabetes Program Recommendations:    Spoke with patient at bedside.  He states that caring for his diabetes has been a challenge this year due to insurance.  He currently has a high deductible plan and very little of his insulins are being covered.  He also could not afford CGM.   Patient is interested in CGM for home stating his control has been much better with CGM.  In the past he worked in the heat and therefore had a difficult time keeping sensor in place, due to perspiration.   Patient admits that he has been buying Regular insulin from [employer] instead of use of Novolog due to cost.  Reminded patient that Regular insulin should be given 30 minutes prior to meal instead of "with meal" due to slower onset.   Per MD, order received to restart Freestyle Libre.  Also gave patient coupon for [facility] (no more than 75$ per month).  Patient is getting a new insurance policy on Jan 1 which hopefully will cover supplies and insulin better.  MD ordered application of Freestyle CGM at discharge for  patient. Education done regarding application and changing CGM sensor (alternate every 14 days on back of arms), 1 hour warm-up, use of glucometer when alert displays, how to scan CGM for glucose reading and information for PCP. Patient has also been given educational packet regarding use CGM sensor including the 1-800 toll free number for any questions, problems or needs related to the [facility] sensors or reader.   Sensor applied by patient to (L) Arm at 12 noon.  Explained that glucose readings will not be available until 1 hour after application. Reviewed use of CGM including how to scan, changing Sensor, Vitamin C warning, arrows with glucose readings, and Freestyle app.    Recommend adding Semglee 20 units x1 (patient only received Semglee 15 units this morning).  Encouraged patient to f/u with PCP/endocrinologist ASAP.  Patient to d/c today.   Thanks,  Beryl Meager, RN, BC-ADM Inpatient Diabetes Coordinator Pager [phone]  (8a-5p)

## 2021-06-16 ENCOUNTER — Other Ambulatory Visit: Payer: Self-pay

## 2021-06-16 ENCOUNTER — Encounter (HOSPITAL_BASED_OUTPATIENT_CLINIC_OR_DEPARTMENT_OTHER): Payer: Self-pay | Admitting: Emergency Medicine

## 2021-06-16 ENCOUNTER — Emergency Department (HOSPITAL_BASED_OUTPATIENT_CLINIC_OR_DEPARTMENT_OTHER): Payer: 59

## 2021-06-16 ENCOUNTER — Emergency Department (HOSPITAL_BASED_OUTPATIENT_CLINIC_OR_DEPARTMENT_OTHER)
Admission: EM | Admit: 2021-06-16 | Discharge: 2021-06-16 | Disposition: A | Discharge status: Home / Self Care | Payer: 59 | Attending: Emergency Medicine | Admitting: Emergency Medicine

## 2021-06-16 DIAGNOSIS — Z7982 Long term (current) use of aspirin: Secondary | ICD-10-CM | POA: Diagnosis not present

## 2021-06-16 DIAGNOSIS — Z794 Long term (current) use of insulin: Secondary | ICD-10-CM | POA: Diagnosis not present

## 2021-06-16 DIAGNOSIS — E1165 Type 2 diabetes mellitus with hyperglycemia: Secondary | ICD-10-CM | POA: Diagnosis not present

## 2021-06-16 DIAGNOSIS — R519 Headache, unspecified: Secondary | ICD-10-CM

## 2021-06-16 DIAGNOSIS — E11319 Type 2 diabetes mellitus with unspecified diabetic retinopathy without macular edema: Secondary | ICD-10-CM | POA: Insufficient documentation

## 2021-06-16 DIAGNOSIS — H40051 Ocular hypertension, right eye: Secondary | ICD-10-CM

## 2021-06-16 DIAGNOSIS — U071 COVID-19: Secondary | ICD-10-CM | POA: Diagnosis not present

## 2021-06-16 LAB — CBC WITH DIFFERENTIAL/PLATELET
Abs Immature Granulocytes: 0.01 10*3/uL (ref 0.00–0.07)
Basophils Absolute: 0.1 10*3/uL (ref 0.0–0.1)
Basophils Relative: 1 %
Eosinophils Absolute: 0.1 10*3/uL (ref 0.0–0.5)
Eosinophils Relative: 3 %
HCT: 36.2 % — ABNORMAL LOW (ref 39.0–52.0)
Hemoglobin: 13 g/dL (ref 13.0–17.0)
Immature Granulocytes: 0 %
Lymphocytes Relative: 38 %
Lymphs Abs: 1.8 10*3/uL (ref 0.7–4.0)
MCH: 30 pg (ref 26.0–34.0)
MCHC: 35.9 g/dL (ref 30.0–36.0)
MCV: 83.4 fL (ref 80.0–100.0)
Monocytes Absolute: 0.5 10*3/uL (ref 0.1–1.0)
Monocytes Relative: 10 %
Neutro Abs: 2.3 10*3/uL (ref 1.7–7.7)
Neutrophils Relative %: 48 %
Platelets: 245 10*3/uL (ref 150–400)
RBC: 4.34 MIL/uL (ref 4.22–5.81)
RDW: 12.7 % (ref 11.5–15.5)
WBC: 4.8 10*3/uL (ref 4.0–10.5)
nRBC: 0 % (ref 0.0–0.2)

## 2021-06-16 LAB — BASIC METABOLIC PANEL
Anion gap: 10 (ref 5–15)
BUN: 15 mg/dL (ref 6–20)
CO2: 24 mmol/L (ref 22–32)
Calcium: 8.4 mg/dL — ABNORMAL LOW (ref 8.9–10.3)
Chloride: 102 mmol/L (ref 98–111)
Creatinine, Ser: 0.95 mg/dL (ref 0.61–1.24)
GFR, Estimated: >60 mL/min (ref >60)
Glucose, Bld: 354 mg/dL — ABNORMAL HIGH (ref 70–99)
Potassium: 3.9 mmol/L (ref 3.5–5.1)
Sodium: 136 mmol/L (ref 135–145)

## 2021-06-16 LAB — CBG MONITORING, ED
Glucose-Capillary: 334 mg/dL — ABNORMAL HIGH (ref 70–99)
Glucose-Capillary: 352 mg/dL — ABNORMAL HIGH (ref 70–99)
Glucose-Capillary: 357 mg/dL — ABNORMAL HIGH (ref 70–99)
Glucose-Capillary: 262 mg/dL — ABNORMAL HIGH (ref 70–99)

## 2021-06-16 MED ORDER — FLUORESCEIN SODIUM 1 MG OP STRP: 1.0000 | ORAL_STRIP | Freq: Once | OPHTHALMIC | Status: AC

## 2021-06-16 MED ORDER — TETRACAINE HCL 0.5 % OP SOLN
1.0000 [drp] | Freq: Once | OPHTHALMIC | Status: AC
  Administered 2021-06-16: 1 [drp] via OPHTHALMIC
  Filled 2021-06-16: qty 4

## 2021-06-16 MED ORDER — TIMOLOL MALEATE 0.5 % OP SOLN: 1.0000 [drp] | Freq: Once | OPHTHALMIC | Status: DC

## 2021-06-16 MED ORDER — INSULIN ASPART 100 UNIT/ML IJ SOLN
3.0000 [IU] | Freq: Once | INTRAMUSCULAR | Status: AC
Start: 2021-06-16 — End: 2021-06-16
  Administered 2021-06-16: 3 [IU] via INTRAVENOUS

## 2021-06-16 MED ORDER — BRIMONIDINE TARTRATE 0.2 % OP SOLN
1.0000 [drp] | Freq: Once | OPHTHALMIC | Status: AC
Start: 2021-06-16 — End: 2021-06-16
  Administered 2021-06-16: 1 [drp] via OPHTHALMIC
  Filled 2021-06-16: qty 5

## 2021-06-16 MED ORDER — TIMOLOL MALEATE 0.5 % OP SOLN
1.0000 [drp] | Freq: Once | OPHTHALMIC | Status: AC
  Administered 2021-06-16: 1 [drp] via OPHTHALMIC
  Filled 2021-06-16: qty 5

## 2021-06-16 MED ORDER — TIMOLOL MALEATE 0.5 % OP SOLN
1.0000 [drp] | Freq: Once | OPHTHALMIC | Status: DC
  Filled 2021-06-16: qty 5

## 2021-06-16 MED ORDER — MAGNESIUM SULFATE 2 GM/50ML IV SOLN
2.0000 g | Freq: Once | INTRAVENOUS | Status: AC
  Administered 2021-06-16: 2 g via INTRAVENOUS
  Filled 2021-06-16: qty 50

## 2021-06-16 MED ORDER — KETOROLAC TROMETHAMINE 30 MG/ML IJ SOLN
30.0000 mg | Freq: Once | INTRAMUSCULAR | Status: AC
  Administered 2021-06-16: 30 mg via INTRAVENOUS
  Filled 2021-06-16: qty 1

## 2021-06-16 MED ORDER — SODIUM CHLORIDE 0.9 % IV BOLUS
500.0000 mL | Freq: Once | INTRAVENOUS | Status: AC
  Administered 2021-06-16: 500 mL via INTRAVENOUS

## 2021-06-16 MED ORDER — INSULIN ASPART 100 UNIT/ML IJ SOLN
3.0000 [IU] | Freq: Once | INTRAMUSCULAR | Status: AC
  Administered 2021-06-16: 3 [IU] via SUBCUTANEOUS

## 2021-06-16 MED ORDER — DORZOLAMIDE HCL-TIMOLOL MAL 2-0.5 % OP SOLN: 1.0000 [drp] | Freq: Two times a day (BID) | OPHTHALMIC | Status: DC

## 2021-06-16 MED ORDER — FLUORESCEIN SODIUM 1 MG OP STRP
ORAL_STRIP | OPHTHALMIC | Status: AC
  Administered 2021-06-16: 1 via OPHTHALMIC
  Filled 2021-06-16: qty 1

## 2021-06-16 NOTE — ED Notes (Addendum)
Patient removed contact from the right eye. Did not want to remove the left contact. Patient can only read the first line of the visual acuity card (20/200) when using his right eye only.  ?

## 2021-06-16 NOTE — ED Notes (Signed)
TO CT

## 2021-06-16 NOTE — ED Notes (Signed)
Checked CBG 357, RN informed ?

## 2021-06-16 NOTE — ED Provider Notes (Signed)
?[facility] ?Provider Note ? ? ?CSN: [id number] ?Arrival date & time: 06/16/21  0030 ? ?  ? ?History ? ?Chief Complaint  ?Patient presents with  ? Headache  ? Eye Pain  ? ? ?Sean Richards is a 36 y.o. male. ? ?The history is provided by the patient.  ?Headache ?Pain location:  R parietal and R temporal ?Quality:  Dull ?Radiates to: also has right eye pain with a h/o diabetic eye disease. ?Severity currently:  8/10 ?Severity at highest:  8/10 ?Onset quality:  Gradual ?Duration: hours. ?Timing:  Constant ?Chronicity:  Recurrent (has migraines but currently has covid) ?Relieved by:  Nothing ?Worsened by:  Nothing ?Ineffective treatments:  None tried ?Associated symptoms: eye pain   ?Associated symptoms: no abdominal pain, no congestion, no dizziness, no numbness and no weakness   ?Risk factors: no anger   ?Eye Pain ?Associated symptoms include headaches. Pertinent negatives include no chest pain, no abdominal pain and no shortness of breath.  ?Patient with DM and diabetic eye disease who is not taking his COSOPT eye drops presents with headache and R eye pain.  Patient was diagnosed with COVID 19 this week and has refused paxlovid.  States he has pressure behind the right eye and in the right side of the head.  No neck pain.  No changes in vision, speech, weakness nor numbness. Sugars have been in the high 200s at home.   ?  ? ?Home Medications ?Prior to Admission medications   ?Medication Sig Start Date End Date Taking? Authorizing Provider  ?aspirin-acetaminophen-caffeine (EXCEDRIN MIGRAINE) 250-250-65 MG tablet Take 2 tablets by mouth every 6 (six) hours as needed for headache.    [provider]  ?atorvastatin (LIPITOR) 80 MG tablet Take 1 tablet (80 mg total) by mouth daily. 02/01/21 03/03/21  Kerney Elbe, DO  ?calcium carbonate (TUMS - DOSED IN MG ELEMENTAL CALCIUM) 500 MG chewable tablet Chew 2 tablets by mouth daily as needed for indigestion or heartburn.     [provider]  ?Coenzyme Q10 (CO Q 10 PO) Take 2 tablets by mouth at bedtime.    [provider]  ?dorzolamide-timolol (COSOPT) 22.3-6.8 MG/ML ophthalmic solution Place 1 drop into both eyes in the morning and at bedtime. 01/11/21   [provider]  ?ezetimibe (ZETIA) 10 MG tablet Take 1 tablet (10 mg total) by mouth daily. 02/01/21   Raiford Noble Latif, DO  ?ibuprofen (ADVIL) 200 MG tablet Take 200 mg by mouth every 6 (six) hours as needed for mild pain or headache.    [provider]  ?insulin aspart (NOVOLOG) 100 UNIT/ML injection Inject 5-25 Units into the skin See admin instructions. 4 to 5 times a day with a meal. Per sliding scale    [provider]  ?insulin glargine (LANTUS) 100 UNIT/ML injection Inject 52 Units into the skin at bedtime.    [provider]  ?nicotine (NICODERM CQ - DOSED IN MG/24 HOURS) 14 mg/24hr patch Place 1 patch (14 mg total) onto the skin daily as needed (smoking cessation). 01/31/21   Raiford Noble Latif, DO  ?pantoprazole (PROTONIX) 20 MG tablet Take 1 tablet (20 mg total) by mouth daily. 05/18/17   Agam Davenport, MD  ?   ? ?Allergies    ?Patient has no known allergies.   ? ?Review of Systems   ?Review of Systems  ?Constitutional:  Negative for diaphoresis.  ?HENT:  Negative for congestion and facial swelling.   ?Eyes:  Positive for pain. Negative  for redness and visual disturbance.  ?Respiratory:  Negative for shortness of breath.   ?Cardiovascular:  Negative for chest pain.  ?Gastrointestinal:  Negative for abdominal pain.  ?Genitourinary:  Negative for dysuria.  ?Neurological:  Positive for headaches. Negative for dizziness, facial asymmetry, speech difficulty, weakness, light-headedness and numbness.  ?Psychiatric/Behavioral:  Negative for agitation.   ?All other systems reviewed and are negative. ? ?Physical Exam ?Updated Vital Signs ?BP 134/77   Pulse 82   Temp 98 ?F (36.7 ?C) (Oral)   Resp 14   Ht 6' (1.829 m)   Wt  106.6 kg   SpO2 98%   BMI 31.87 kg/m?  ?Physical Exam ?Vitals and nursing note reviewed.  ?Constitutional:   ?   General: He is not in acute distress. ?   Appearance: Normal appearance.  ?HENT:  ?   Head: Normocephalic and atraumatic.  ?   Nose: Nose normal.  ?   Mouth/Throat:  ?   Mouth: Mucous membranes are moist.  ?Eyes:  ?   General: Lids are normal. Lids are everted, no foreign bodies appreciated. No visual field deficit.    ?   Right eye: No foreign body, discharge or hordeolum.  ?   Intraocular pressure: Right eye pressure is 32 mmHg.  ?   Extraocular Movements: Extraocular movements intact.  ?   Right eye: Normal extraocular motion.  ?   Conjunctiva/sclera: Conjunctivae normal.  ?   Pupils: Pupils are equal, round, and reactive to light.  ?   Comments: No corneal abrasion.  Pupil is reactive, no mid dilated no haziness.    ?Neurological:  ?   Mental Status: He is alert.  ? ? ?ED Results / Procedures / Treatments   ?Labs ?(all labs ordered are listed, but only abnormal results are displayed) ?Results for orders placed or performed during the hospital encounter of 06/16/21  ?CBC with Differential  ?Result Value Ref Range  ? WBC 4.8 4.0 - 10.5 K/uL  ? RBC 4.34 4.22 - 5.81 MIL/uL  ? Hemoglobin 13.0 13.0 - 17.0 g/dL  ? HCT 36.2 (L) 39.0 - 52.0 %  ? MCV 83.4 80.0 - 100.0 fL  ? MCH 30.0 26.0 - 34.0 pg  ? MCHC 35.9 30.0 - 36.0 g/dL  ? RDW 12.7 11.5 - 15.5 %  ? Platelets 245 150 - 400 K/uL  ? nRBC 0.0 0.0 - 0.2 %  ? Neutrophils Relative % 48 %  ? Neutro Abs 2.3 1.7 - 7.7 K/uL  ? Lymphocytes Relative 38 %  ? Lymphs Abs 1.8 0.7 - 4.0 K/uL  ? Monocytes Relative 10 %  ? Monocytes Absolute 0.5 0.1 - 1.0 K/uL  ? Eosinophils Relative 3 %  ? Eosinophils Absolute 0.1 0.0 - 0.5 K/uL  ? Basophils Relative 1 %  ? Basophils Absolute 0.1 0.0 - 0.1 K/uL  ? Immature Granulocytes 0 %  ? Abs Immature Granulocytes 0.01 0.00 - 0.07 K/uL  ?Basic metabolic panel  ?Result Value Ref Range  ? Sodium 136 135 - 145 mmol/L  ? Potassium 3.9  3.5 - 5.1 mmol/L  ? Chloride 102 98 - 111 mmol/L  ? CO2 24 22 - 32 mmol/L  ? Glucose, Bld 354 (H) 70 - 99 mg/dL  ? BUN 15 6 - 20 mg/dL  ? Creatinine, Ser 0.95 0.61 - 1.24 mg/dL  ? Calcium 8.4 (L) 8.9 - 10.3 mg/dL  ? GFR, Estimated >60 >60 mL/min  ? Anion gap 10 5 - 15  ?POC CBG, ED  ?  Result Value Ref Range  ? Glucose-Capillary 334 (H) 70 - 99 mg/dL  ? ?CT Head Wo Contrast ? ?Result Date: 06/16/2021 ?CLINICAL DATA:  Headache and right orbital pain. EXAM: CT HEAD AND ORBITS WITHOUT CONTRAST TECHNIQUE: Contiguous axial images were obtained from the base of the skull through the vertex without contrast. Multidetector CT imaging of the orbits was performed using the standard protocol without intravenous contrast. RADIATION DOSE REDUCTION: This exam was performed according to the departmental dose-optimization program which includes automated exposure control, adjustment of the mA and/or kV according to patient size and/or use of iterative reconstruction technique. COMPARISON:  None. FINDINGS: CT HEAD FINDINGS Brain: The ventricles and sulci are appropriate size for the patient's age. The gray-white matter discrimination is preserved. There is no acute intracranial hemorrhage. No mass effect or midline shift. No extra-axial fluid collection. Vascular: No hyperdense vessel or unexpected calcification. Skull: Normal. Negative for fracture or focal lesion. Other: None CT ORBITS FINDINGS Orbits:  The globes and retro-orbital fat are preserved. Visualized sinuses: There is diffuse mucoperiosteal thickening of paranasal sinuses. No air-fluid level. The mastoid air cells are clear. Soft tissues: Negative. IMPRESSION: 1. Unremarkable noncontrast CT of the brain. 2. No acute orbital pathology. 3. Paranasal sinus disease. Electronically Signed   By: Anner Crete M.D.   On: 06/16/2021 01:35  ? ?CT Orbits Wo Contrast ? ?Result Date: 06/16/2021 ?CLINICAL DATA:  Headache and right orbital pain. EXAM: CT HEAD AND ORBITS WITHOUT  CONTRAST TECHNIQUE: Contiguous axial images were obtained from the base of the skull through the vertex without contrast. Multidetector CT imaging of the orbits was performed using the standard protocol without intrave

## 2021-06-16 NOTE — ED Notes (Signed)
Patient states his headache pain and vision is better.  ?

## 2021-06-16 NOTE — ED Triage Notes (Addendum)
Patient reports eye pain that starts in the inner corner of the right eye. Pain started at 930pm. Patient is causing him to have a headache. Patient has history of migraine and headaches and states this feels different. No numbness, tingling, or visual changes reported.  ?

## 2023-02-12 IMAGING — CT CT HEAD W/O CM
4 series · 16 of 47 positions shown, 18 images · non-contrast
Comparison: None.

CLINICAL DATA: Headache and right orbital pain.

EXAM:
CT HEAD AND ORBITS WITHOUT CONTRAST
TECHNIQUE: Contiguous axial images were obtained from the base of the skull
through the vertex without contrast. Multidetector CT imaging of the
orbits was performed using the standard protocol without intravenous
contrast.
RADIATION DOSE REDUCTION: This exam was performed according to the
departmental dose-optimization program which includes automated
exposure control, adjustment of the mA and/or kV according to
patient size and/or use of iterative reconstruction technique.

[Series 3: head wo · axial · 0.45mm/px · z∈[-56,+68]mm · 7 of 35 slices shown, 9 images]
[im 5/35  brain]
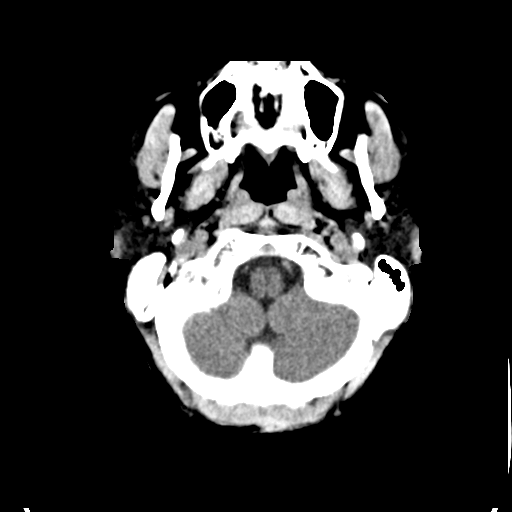
[im 5/35  bone]
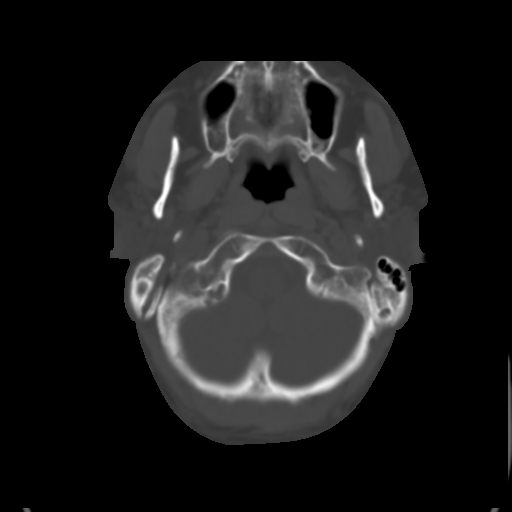
[im 9/35  brain]
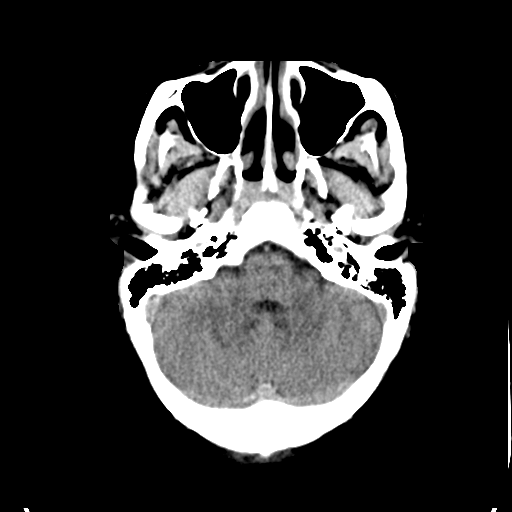
[im 13/35  brain]
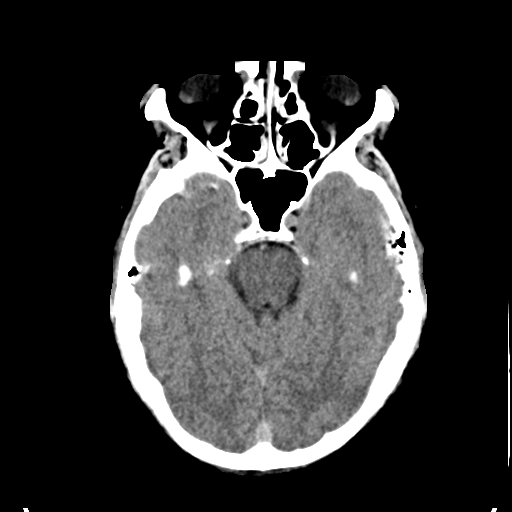
[im 18/35  brain]
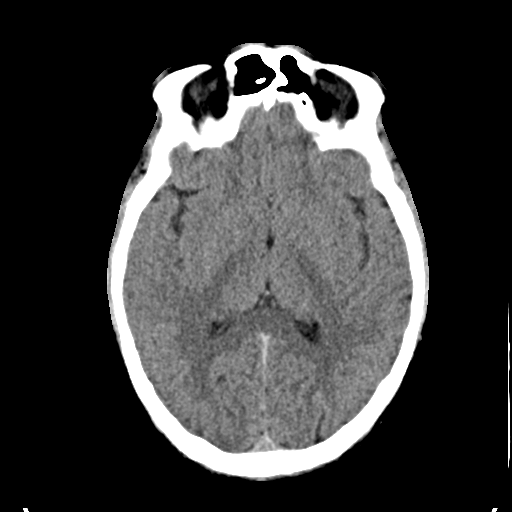
[im 22/35  brain]
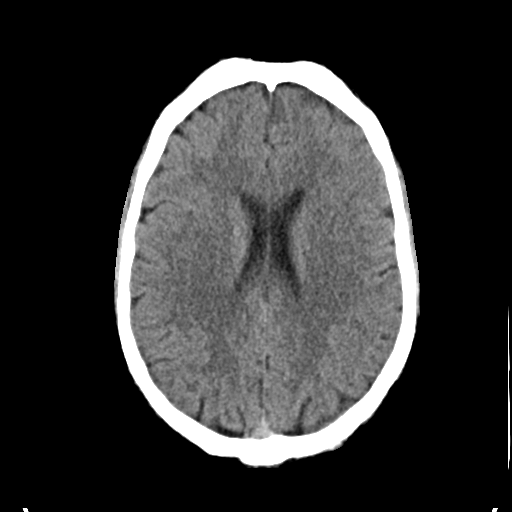
[im 22/35  bone]
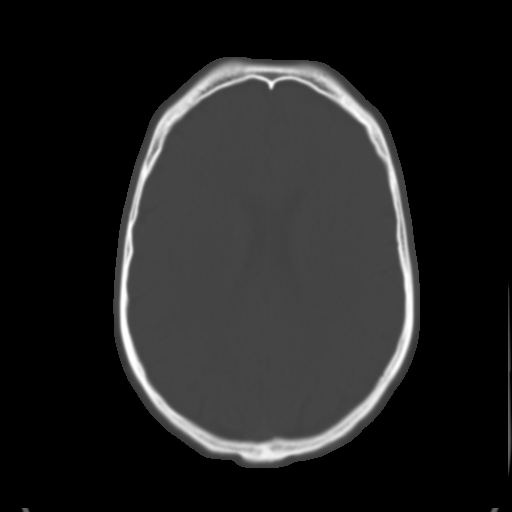
[im 26/35  brain]
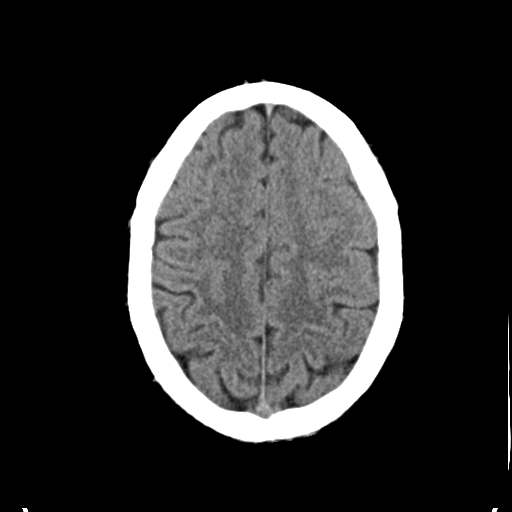
[im 30/35  brain]
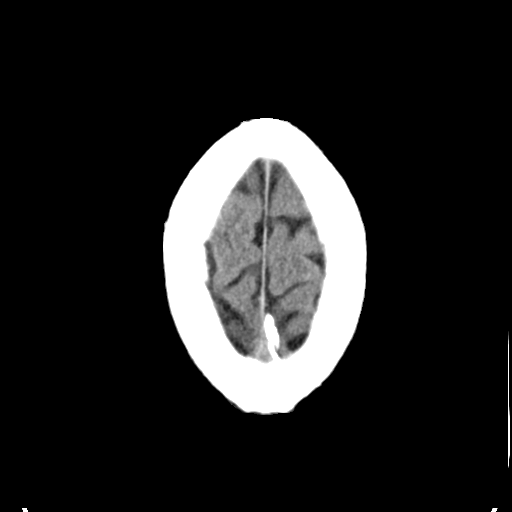

[Series 4: head bone · axial · 0.45mm/px · z∈[-60,-26]mm · 3 of 87 slices shown]
[im 9/87  bone]
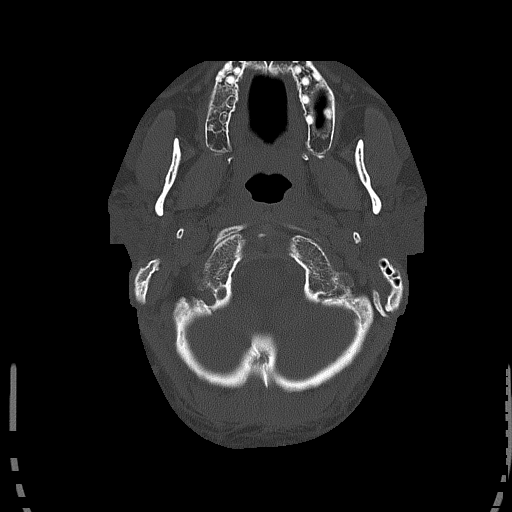
[im 18/87  bone]
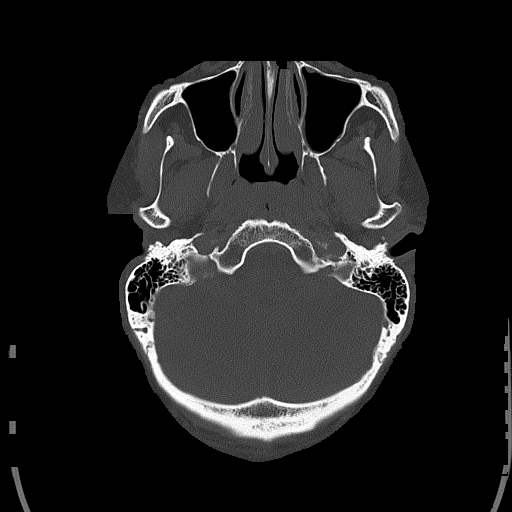
[im 26/87  bone]
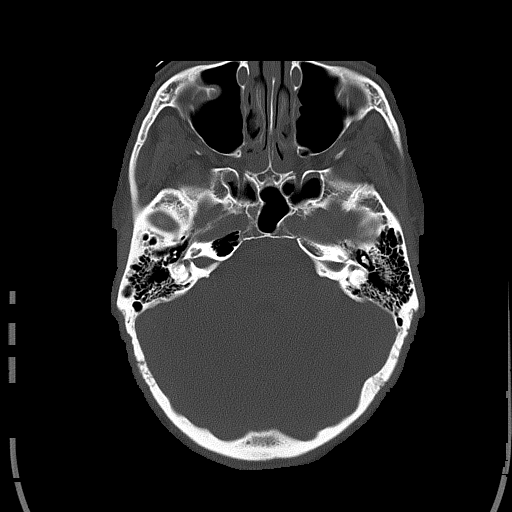

[Series 5: coronal soft · coronal · 0.36mm/px · 3 of 76 slices shown]
[im 26/76  brain]
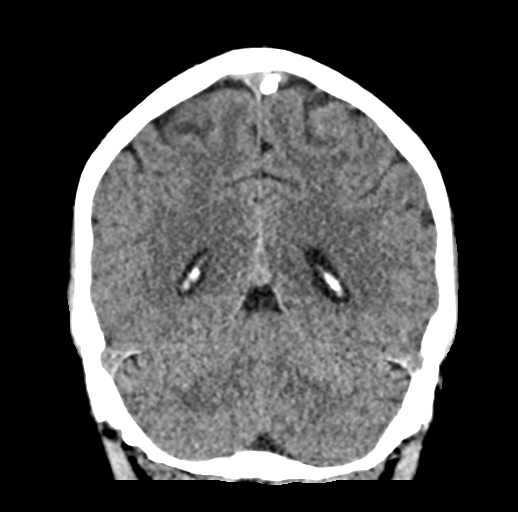
[im 34/76  brain]
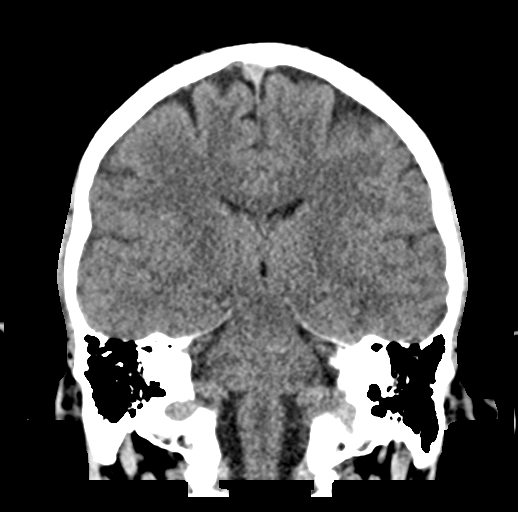
[im 42/76  brain]
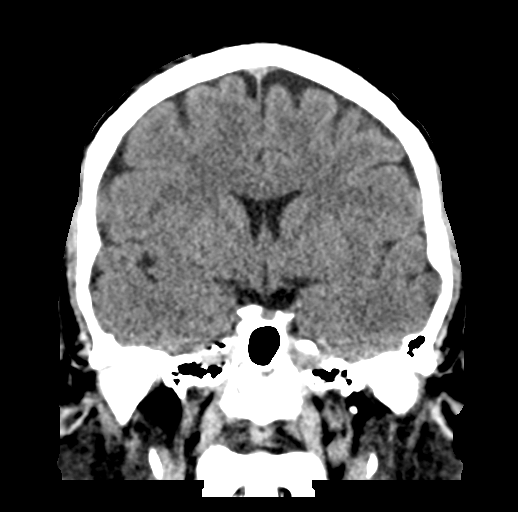

[Series 6: sag soft · sagittal · 0.36mm/px · 3 of 67 slices shown]
[im 23/67  brain]
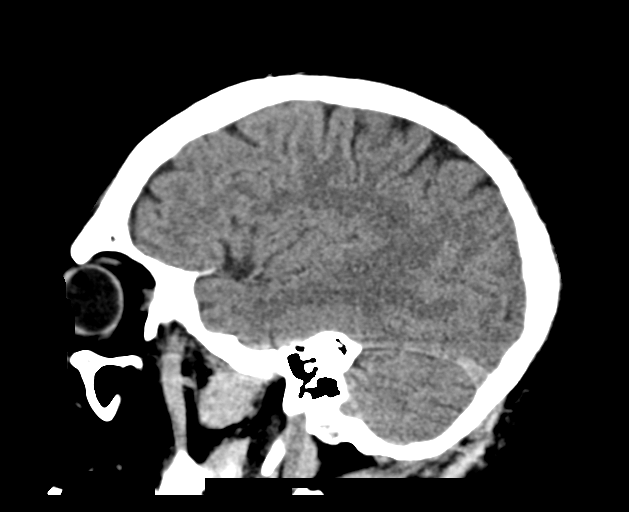
[im 34/67  brain]
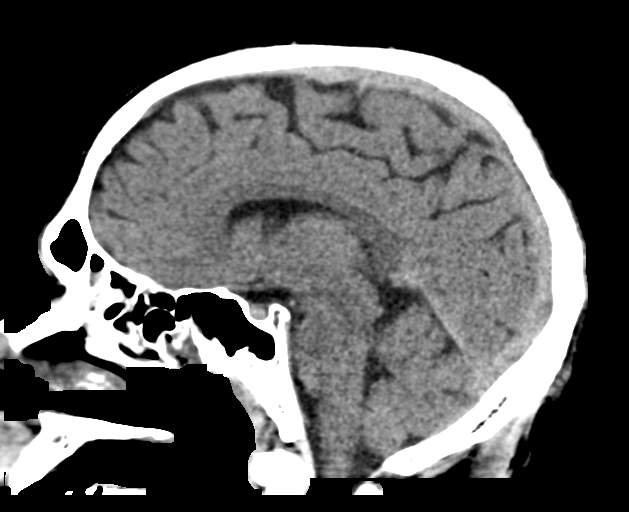
[im 45/67  brain]
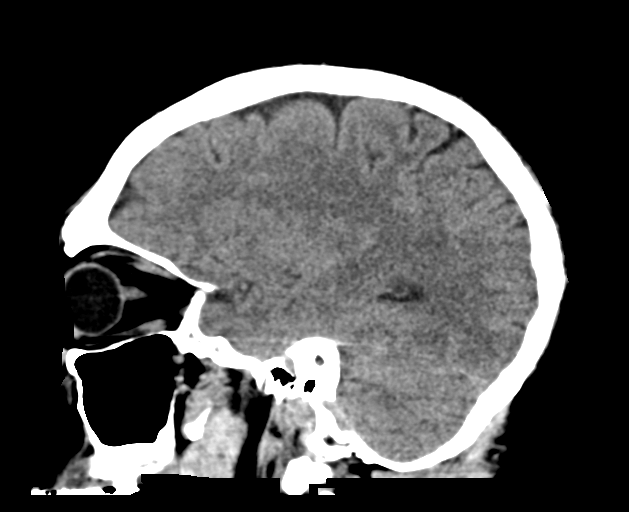

[16 of 47 positions shown; findings below may reference images not displayed]

FINDINGS: CT HEAD FINDINGS

Brain: The ventricles and sulci are appropriate size for the
patient's age. The gray-white matter discrimination is preserved.
There is no acute intracranial hemorrhage. No mass effect or midline
shift. No extra-axial fluid collection.

Vascular: No hyperdense vessel or unexpected calcification.

Skull: Normal. Negative for fracture or focal lesion.

Other: None

CT ORBITS FINDINGS

Orbits:  The globes and retro-orbital fat are preserved.

Visualized sinuses: There is diffuse mucoperiosteal thickening of
paranasal sinuses. No air-fluid level. The mastoid air cells are
clear.

Soft tissues: Negative.
IMPRESSION: 1. Unremarkable noncontrast CT of the brain.
2. No acute orbital pathology.
3. Paranasal sinus disease.
